# Patient Record
Sex: Male | Born: 1935 | Race: White | Hispanic: No | Marital: Married | Smoking: Never smoker
Health system: Southern US, Community
[De-identification: ages and names within clinical notes are randomized; demographics above are authoritative.]

## PROBLEM LIST (undated history)

## (undated) DIAGNOSIS — N200 Calculus of kidney: Secondary | ICD-10-CM

## (undated) DIAGNOSIS — N400 Enlarged prostate without lower urinary tract symptoms: Secondary | ICD-10-CM

## (undated) DIAGNOSIS — R32 Unspecified urinary incontinence: Secondary | ICD-10-CM

## (undated) DIAGNOSIS — I4891 Unspecified atrial fibrillation: Secondary | ICD-10-CM

## (undated) DIAGNOSIS — J189 Pneumonia, unspecified organism: Secondary | ICD-10-CM

## (undated) DIAGNOSIS — G20 Parkinson's disease: Secondary | ICD-10-CM

## (undated) DIAGNOSIS — F0390 Unspecified dementia without behavioral disturbance: Secondary | ICD-10-CM

## (undated) DIAGNOSIS — E039 Hypothyroidism, unspecified: Secondary | ICD-10-CM

## (undated) DIAGNOSIS — R296 Repeated falls: Secondary | ICD-10-CM

## (undated) DIAGNOSIS — R627 Adult failure to thrive: Secondary | ICD-10-CM

## (undated) DIAGNOSIS — G20A1 Parkinson's disease without dyskinesia, without mention of fluctuations: Secondary | ICD-10-CM

## (undated) HISTORY — PX: HERNIA REPAIR: SHX51

## (undated) HISTORY — DX: Benign prostatic hyperplasia without lower urinary tract symptoms: N40.0

## (undated) HISTORY — DX: Adult failure to thrive: R62.7

## (undated) HISTORY — PX: PROSTATE SURGERY: SHX751

## (undated) HISTORY — DX: Unspecified urinary incontinence: R32

## (undated) HISTORY — DX: Calculus of kidney: N20.0

## (undated) HISTORY — PX: NEPHRECTOMY: SHX65

## (undated) HISTORY — DX: Repeated falls: R29.6

## (undated) HISTORY — PX: BLADDER SURGERY: SHX569

## (undated) HISTORY — DX: Hypothyroidism, unspecified: E03.9

---

## 2006-08-13 ENCOUNTER — Ambulatory Visit: Payer: Self-pay | Admitting: Family Medicine

## 2006-08-15 ENCOUNTER — Ambulatory Visit: Payer: Self-pay | Admitting: Family Medicine

## 2006-08-23 ENCOUNTER — Ambulatory Visit: Payer: Self-pay | Admitting: Family Medicine

## 2006-10-05 ENCOUNTER — Ambulatory Visit: Payer: Self-pay | Admitting: Family Medicine

## 2006-10-10 ENCOUNTER — Ambulatory Visit: Payer: Self-pay | Admitting: Family Medicine

## 2006-10-10 LAB — CONVERTED CEMR LAB
ALT: 11 units/L (ref 0–40)
Chol/HDL Ratio, serum: 5.2
Cholesterol: 159 mg/dL (ref 0–200)
Triglyceride fasting, serum: 82 mg/dL (ref 0–149)

## 2006-10-31 ENCOUNTER — Ambulatory Visit: Payer: Self-pay | Admitting: Family Medicine

## 2006-11-27 DIAGNOSIS — G20 Parkinson's disease: Secondary | ICD-10-CM

## 2006-11-27 DIAGNOSIS — E785 Hyperlipidemia, unspecified: Secondary | ICD-10-CM

## 2007-05-16 ENCOUNTER — Encounter (INDEPENDENT_AMBULATORY_CARE_PROVIDER_SITE_OTHER): Payer: Self-pay | Admitting: Family Medicine

## 2007-06-18 ENCOUNTER — Telehealth (INDEPENDENT_AMBULATORY_CARE_PROVIDER_SITE_OTHER): Payer: Self-pay | Admitting: Family Medicine

## 2007-09-13 ENCOUNTER — Encounter (INDEPENDENT_AMBULATORY_CARE_PROVIDER_SITE_OTHER): Payer: Self-pay | Admitting: Family Medicine

## 2007-10-09 ENCOUNTER — Telehealth (INDEPENDENT_AMBULATORY_CARE_PROVIDER_SITE_OTHER): Payer: Self-pay | Admitting: *Deleted

## 2007-10-17 ENCOUNTER — Encounter (INDEPENDENT_AMBULATORY_CARE_PROVIDER_SITE_OTHER): Payer: Self-pay | Admitting: Family Medicine

## 2008-02-11 ENCOUNTER — Telehealth (INDEPENDENT_AMBULATORY_CARE_PROVIDER_SITE_OTHER): Payer: Self-pay | Admitting: *Deleted

## 2008-04-29 ENCOUNTER — Telehealth (INDEPENDENT_AMBULATORY_CARE_PROVIDER_SITE_OTHER): Payer: Self-pay | Admitting: *Deleted

## 2010-04-28 ENCOUNTER — Ambulatory Visit (HOSPITAL_BASED_OUTPATIENT_CLINIC_OR_DEPARTMENT_OTHER): Admission: RE | Admit: 2010-04-28 | Discharge: 2010-04-28 | Payer: Self-pay | Admitting: Orthopedic Surgery

## 2011-01-22 LAB — POCT HEMOGLOBIN-HEMACUE: Hemoglobin: 14.2 g/dL (ref 13.0–17.0)

## 2011-03-24 NOTE — Assessment & Plan Note (Signed)
[facility]                                 ON-CALL NOTE   Martin Lowe, Martin Lowe                        MRN:          [id number]  DATE:08/05/2006                            DOB:          1934-10-04    Phone number is [phone].   CHIEF COMPLAINT:  UTI.   Patient states he thinks he has a bladder infection.  He has urinary  frequency every 20 to 30 minutes all night.  He saw blood in his urine  today.  He denies fever, chills, back pain or other symptoms.  He says  it really stings to urinate.  I told him he may have a urinary tract  infection and to go to an Urgent Care or the emergency room now for  evaluation before it gets worse and that is what he is going to do.            ______________________________  Audrie Gallus. Tower, MD      MAT/MedQ  DD:  08/05/2006  DT:  08/06/2006  Job #:  [id number]   cc:   Leanne Chang, M.D.

## 2015-11-22 ENCOUNTER — Emergency Department (HOSPITAL_COMMUNITY)
Admission: EM | Admit: 2015-11-22 | Discharge: 2015-11-22 | Disposition: A | Discharge status: Home / Self Care | Payer: Medicare Other | Attending: Emergency Medicine | Admitting: Emergency Medicine

## 2015-11-22 ENCOUNTER — Emergency Department (HOSPITAL_COMMUNITY): Payer: Medicare Other

## 2015-11-22 ENCOUNTER — Encounter (HOSPITAL_COMMUNITY): Payer: Self-pay | Admitting: Emergency Medicine

## 2015-11-22 ENCOUNTER — Other Ambulatory Visit: Payer: Self-pay

## 2015-11-22 DIAGNOSIS — F0390 Unspecified dementia without behavioral disturbance: Secondary | ICD-10-CM | POA: Insufficient documentation

## 2015-11-22 DIAGNOSIS — R51 Headache: Secondary | ICD-10-CM | POA: Insufficient documentation

## 2015-11-22 DIAGNOSIS — N390 Urinary tract infection, site not specified: Secondary | ICD-10-CM | POA: Insufficient documentation

## 2015-11-22 DIAGNOSIS — R4182 Altered mental status, unspecified: Secondary | ICD-10-CM | POA: Diagnosis not present

## 2015-11-22 DIAGNOSIS — G20 Parkinson's disease: Secondary | ICD-10-CM | POA: Insufficient documentation

## 2015-11-22 DIAGNOSIS — R451 Restlessness and agitation: Secondary | ICD-10-CM | POA: Diagnosis not present

## 2015-11-22 DIAGNOSIS — Z79899 Other long term (current) drug therapy: Secondary | ICD-10-CM | POA: Insufficient documentation

## 2015-11-22 DIAGNOSIS — M6281 Muscle weakness (generalized): Secondary | ICD-10-CM | POA: Diagnosis present

## 2015-11-22 HISTORY — DX: Parkinson's disease without dyskinesia, without mention of fluctuations: G20.A1

## 2015-11-22 HISTORY — DX: Unspecified dementia, unspecified severity, without behavioral disturbance, psychotic disturbance, mood disturbance, and anxiety: F03.90

## 2015-11-22 HISTORY — DX: Parkinson's disease: G20

## 2015-11-22 LAB — CBC WITH DIFFERENTIAL/PLATELET
BASOS PCT: 0 %
Basophils Absolute: 0 10*3/uL (ref 0.0–0.1)
EOS ABS: 0 10*3/uL (ref 0.0–0.7)
Eosinophils Relative: 0 %
HCT: 42.6 % (ref 39.0–52.0)
HEMOGLOBIN: 14.5 g/dL (ref 13.0–17.0)
LYMPHS ABS: 0.7 10*3/uL (ref 0.7–4.0)
Lymphocytes Relative: 9 %
MCH: 31.3 pg (ref 26.0–34.0)
MCHC: 34 g/dL (ref 30.0–36.0)
MCV: 91.8 fL (ref 78.0–100.0)
Monocytes Absolute: 1.1 10*3/uL — ABNORMAL HIGH (ref 0.1–1.0)
Monocytes Relative: 15 %
NEUTROS PCT: 76 %
Neutro Abs: 5.8 10*3/uL (ref 1.7–7.7)
Platelets: 279 10*3/uL (ref 150–400)
RBC: 4.64 MIL/uL (ref 4.22–5.81)
RDW: 13.4 % (ref 11.5–15.5)
WBC: 7.7 10*3/uL (ref 4.0–10.5)

## 2015-11-22 LAB — COMPREHENSIVE METABOLIC PANEL
ALT: 8 U/L — ABNORMAL LOW (ref 17–63)
ANION GAP: 11 (ref 5–15)
AST: 23 U/L (ref 15–41)
Albumin: 3.7 g/dL (ref 3.5–5.0)
Alkaline Phosphatase: 69 U/L (ref 38–126)
BUN: 18 mg/dL (ref 6–20)
CALCIUM: 9.5 mg/dL (ref 8.9–10.3)
CHLORIDE: 100 mmol/L — AB (ref 101–111)
CO2: 29 mmol/L (ref 22–32)
Creatinine, Ser: 1.17 mg/dL (ref 0.61–1.24)
GFR calc non Af Amer: 57 mL/min — ABNORMAL LOW (ref >60)
Glucose, Bld: 100 mg/dL — ABNORMAL HIGH (ref 65–99)
POTASSIUM: 4 mmol/L (ref 3.5–5.1)
SODIUM: 140 mmol/L (ref 135–145)
Total Bilirubin: 0.9 mg/dL (ref 0.3–1.2)
Total Protein: 6.5 g/dL (ref 6.5–8.1)

## 2015-11-22 LAB — URINALYSIS, ROUTINE W REFLEX MICROSCOPIC
BILIRUBIN URINE: NEGATIVE
Glucose, UA: NEGATIVE mg/dL
HGB URINE DIPSTICK: NEGATIVE
KETONES UR: 15 mg/dL — AB
Nitrite: POSITIVE — AB
PROTEIN: NEGATIVE mg/dL
Specific Gravity, Urine: 1.018 (ref 1.005–1.030)
pH: 6 (ref 5.0–8.0)

## 2015-11-22 LAB — URINE MICROSCOPIC-ADD ON: RBC / HPF: NONE SEEN RBC/hpf (ref 0–5)

## 2015-11-22 LAB — LIPASE, BLOOD: Lipase: 22 U/L (ref 11–51)

## 2015-11-22 MED ORDER — DEXTROSE 5 % IV SOLN
1.0000 g | Freq: Once | INTRAVENOUS | Status: AC
  Administered 2015-11-22: 1 g via INTRAVENOUS
  Filled 2015-11-22: qty 10

## 2015-11-22 MED ORDER — SODIUM CHLORIDE 0.9 % IV SOLN
INTRAVENOUS | Status: DC
Start: 2015-11-23 — End: 2015-11-22

## 2015-11-22 MED ORDER — CEPHALEXIN 500 MG PO CAPS: 500.0000 mg | ORAL_CAPSULE | Freq: Four times a day (QID) | ORAL | Status: DC

## 2015-11-22 MED ORDER — SODIUM CHLORIDE 0.9 % IV BOLUS (SEPSIS)
250.0000 mL | Freq: Once | INTRAVENOUS | Status: AC
  Administered 2015-11-22: 250 mL via INTRAVENOUS

## 2015-11-22 NOTE — ED Notes (Signed)
Per MD family was notified he can take at home  medication.

## 2015-11-22 NOTE — Discharge Instructions (Signed)
[facility] base [facility] for the Parkinson's disease. Take the antibiotic as directed. Would expect him to be improving in 2 days. Return for any new or worse symptoms.

## 2015-11-22 NOTE — ED Provider Notes (Signed)
CSN: [id number]     Arrival date & time 11/22/15  0945 History   First MD Initiated Contact with Patient 11/22/15 [phone]     Chief Complaint  Patient presents with  . Weakness     (Consider location/radiation/quality/duration/timing/severity/associated sxs/prior Treatment) Patient is a 82 y.o. male presenting with weakness. The history is provided by the patient, the spouse and a relative.  Weakness   patient with known Parkinson's disease and dementia followed the by [facility]. Patient with increased agitation and worsening of his mental status for the past 2-3 days. Family states that he complained of mild headache. No nausea no vomiting no fevers. They believe that his normal baseline weakness to his left hands a little worse than usual.  Past Medical History  Diagnosis Date  . Parkinson disease (HCC)   . Dementia    Past Surgical History  Procedure Laterality Date  . Hernia repair     No family history on file. Social History  Substance Use Topics  . Smoking status: Never Smoker   . Smokeless tobacco: None  . Alcohol Use: No    Review of Systems  Unable to perform ROS: Dementia  Neurological: Positive for weakness.      Allergies  Ciprofloxacin  Home Medications   Prior to Admission medications   Medication Sig Start Date End Date Taking? Authorizing Provider  carbidopa-levodopa (SINEMET CR) 50-200 MG tablet Take 0.5 tablets by mouth 3 (three) times daily. 730a, 1100a, 2pm,   Yes Historical Provider, MD  carbidopa-levodopa (SINEMET IR) 25-100 MG tablet Take 1-1.5 tablets by mouth See admin instructions. Patient takes 1.5 tablets in the morning around 7-8am, 11am, 2 pm  . 0.5 tablet at bedtime.   Yes Historical Provider, MD  DULoxetine (CYMBALTA) 30 MG capsule Take 30 mg by mouth every morning.   Yes Historical Provider, MD  Pimavanserin Tartrate (NUPLAZID) 17 MG TABS Take 34 mg by mouth every morning.   Yes Historical Provider, MD  Polyethylene Glycol 3350 (MIRALAX PO)  Take 17 g by mouth every morning.   Yes Historical Provider, MD  QUEtiapine (SEROQUEL) 25 MG tablet Take 25 mg by mouth at bedtime.   Yes Historical Provider, MD  solifenacin (VESICARE) 10 MG tablet Take 10 mg by mouth daily.   Yes Historical Provider, MD  cephALEXin (KEFLEX) 500 MG capsule Take 1 capsule (500 mg total) by mouth 4 (four) times daily. 11/22/15   Vanetta Mulders, MD   BP 141/73 mmHg  Pulse 79  Resp 18  SpO2 97% Physical Exam  Constitutional: He appears well-developed and well-nourished. No distress.  HENT:  Head: Normocephalic and atraumatic.  Mouth/Throat: Oropharynx is clear and moist.  Eyes: Conjunctivae and EOM are normal. Pupils are equal, round, and reactive to light.  Neck: Normal range of motion. Neck supple.  Cardiovascular: Normal rate and normal heart sounds.   No murmur heard. Pulmonary/Chest: Effort normal and breath sounds normal. No respiratory distress.  Abdominal: Soft. Bowel sounds are normal. There is no tenderness.  Musculoskeletal:  Patient able to follow commands and move all extremities.  Neurological: He is alert.  Long-standing Parkinson's disease known to have weakness on the left greater than right normally right hand greater than right leg. Patient also with mild tremor.  Nursing note and vitals reviewed.   ED Course  Procedures (including critical care time) Labs Review Labs Reviewed  URINALYSIS, ROUTINE W REFLEX MICROSCOPIC (NOT AT [facility]) - Abnormal; Notable for the following:    APPearance HAZY (*)  Ketones, ur 15 (*)    Nitrite POSITIVE (*)    Leukocytes, UA SMALL (*)    All other components within normal limits  COMPREHENSIVE METABOLIC PANEL - Abnormal; Notable for the following:    Chloride 100 (*)    Glucose, Bld 100 (*)    ALT 8 (*)    GFR calc non Af Amer 57 (*)    All other components within normal limits  CBC WITH DIFFERENTIAL/PLATELET - Abnormal; Notable for the following:    Monocytes Absolute 1.1 (*)    All other  components within normal limits  URINE MICROSCOPIC-ADD ON - Abnormal; Notable for the following:    Squamous Epithelial / LPF 0-5 (*)    Bacteria, UA MANY (*)    All other components within normal limits  URINE CULTURE  LIPASE, BLOOD  CBG MONITORING, ED   Results for orders placed or performed during the hospital encounter of 11/22/15  Urinalysis, Routine w reflex microscopic (not at [facility])  Result Value Ref Range   Color, Urine YELLOW YELLOW   APPearance HAZY (A) CLEAR   Specific Gravity, Urine 1.018 1.005 - 1.030   pH 6.0 5.0 - 8.0   Glucose, UA NEGATIVE NEGATIVE mg/dL   Hgb urine dipstick NEGATIVE NEGATIVE   Bilirubin Urine NEGATIVE NEGATIVE   Ketones, ur 15 (A) NEGATIVE mg/dL   Protein, ur NEGATIVE NEGATIVE mg/dL   Nitrite POSITIVE (A) NEGATIVE   Leukocytes, UA SMALL (A) NEGATIVE  Comprehensive metabolic panel  Result Value Ref Range   Sodium 140 135 - 145 mmol/L   Potassium 4.0 3.5 - 5.1 mmol/L   Chloride 100 (L) 101 - 111 mmol/L   CO2 29 22 - 32 mmol/L   Glucose, Bld 100 (H) 65 - 99 mg/dL   BUN 18 6 - 20 mg/dL   Creatinine, Ser 1.611.17 0.61 - 1.24 mg/dL   Calcium 9.5 8.9 - 09.610.3 mg/dL   Total Protein 6.5 6.5 - 8.1 g/dL   Albumin 3.7 3.5 - 5.0 g/dL   AST 23 15 - 41 U/L   ALT 8 (L) 17 - 63 U/L   Alkaline Phosphatase 69 38 - 126 U/L   Total Bilirubin 0.9 0.3 - 1.2 mg/dL   GFR calc non Af Amer 57 (L) >60 mL/min   GFR calc Af Amer >60 >60 mL/min   Anion gap 11 5 - 15  Lipase, blood  Result Value Ref Range   Lipase 22 11 - 51 U/L  CBC with Differential/Platelet  Result Value Ref Range   WBC 7.7 4.0 - 10.5 K/uL   RBC 4.64 4.22 - 5.81 MIL/uL   Hemoglobin 14.5 13.0 - 17.0 g/dL   HCT 04.542.6 40.939.0 - 81.152.0 %   MCV 91.8 78.0 - 100.0 fL   MCH 31.3 26.0 - 34.0 pg   MCHC 34.0 30.0 - 36.0 g/dL   RDW 91.413.4 78.211.5 - 95.615.5 %   Platelets 279 150 - 400 K/uL   Neutrophils Relative % 76 %   Neutro Abs 5.8 1.7 - 7.7 K/uL   Lymphocytes Relative 9 %   Lymphs Abs 0.7 0.7 - 4.0 K/uL    Monocytes Relative 15 %   Monocytes Absolute 1.1 (H) 0.1 - 1.0 K/uL   Eosinophils Relative 0 %   Eosinophils Absolute 0.0 0.0 - 0.7 K/uL   Basophils Relative 0 %   Basophils Absolute 0.0 0.0 - 0.1 K/uL  Urine microscopic-add on  Result Value Ref Range   Squamous Epithelial / LPF 0-5 (A) NONE  SEEN   WBC, UA TOO NUMEROUS TO COUNT 0 - 5 WBC/hpf   RBC / HPF NONE SEEN 0 - 5 RBC/hpf   Bacteria, UA MANY (A) NONE SEEN   Urine-Other MUCOUS PRESENT      Imaging Review Dg Chest 2 View  11/22/2015  CLINICAL DATA:  Left-sided weakness. EXAM: CHEST  2 VIEW COMPARISON:  No prior available for comparison. FINDINGS: Mediastinum and hilar structures normal. Heart size normal. Low lung volumes with mild bibasilar atelectasis and/or infiltrates. No pleural effusion or pneumothorax. IMPRESSION: Low lung volumes with mild bibasilar subsegmental atelectasis and/or infiltrates. Electronically Signed   By: Maisie Fus  Register   On: 11/22/2015 11:55   Ct Head Wo Contrast  11/22/2015  CLINICAL DATA:  Advanced Parkinson disease.  Increased weakness. EXAM: CT HEAD WITHOUT CONTRAST TECHNIQUE: Contiguous axial images were obtained from the base of the skull through the vertex without intravenous contrast. COMPARISON:  07/06/2006 head CT. FINDINGS: No evidence of parenchymal hemorrhage or extra-axial fluid collection. No mass lesion, mass effect, or midline shift. No CT evidence of acute infarction. Intracranial atherosclerosis. Nonspecific mild subcortical and periventricular white matter hypodensity, most in keeping with chronic small vessel ischemic change. Diffuse cerebral volume loss. Cerebral ventricle sizes are concordant with the degree of cerebral volume loss. There is stable chronic complete opacification of the right frontal sinus and anterior right ethmoidal air cells. Otherwise clear visualized paranasal sinuses. The mastoid air cells are unopacified. No evidence of calvarial fracture. IMPRESSION: 1.  No evidence of  acute intracranial abnormality. 2. Diffuse cerebral atrophy, intracranial atherosclerosis and mild chronic small vessel ischemic white matter change. 3. Stable chronic complete right frontal sinus and anterior right ethmoidal air cell opacification, likely representing chronic sinusitis. Electronically Signed   By: Delbert Phenix M.D.   On: 11/22/2015 11:23   Ct Chest Wo Contrast  11/22/2015  CLINICAL DATA:  Shortness of breath weakness possible pneumonia EXAM: CT CHEST WITHOUT CONTRAST TECHNIQUE: Multidetector CT imaging of the chest was performed following the standard protocol without IV contrast. COMPARISON:  11/22/2015 FINDINGS: Mediastinum/Nodes: There is atherosclerotic calcification of the aortic arch and descending thoracic aorta. There is significant 3 vessel coronary artery disease. Given limited evaluation without contrast, there is no significant hilar or mediastinal adenopathy appreciated. There is no significant pericardial effusion. Trace focal small anterior pericardial effusion with trace fluid in superior pericardial recess noted. Lungs/Pleura: No pleural effusion. No evidence of pneumonia. Mild bilateral lower lobe atelectasis. 4 mm pulmonary nodule in the lingula on image number 34. Upper abdomen: Limited visualization of upper abdominal structures show bilateral renal calcifications with cortical renal atrophy bilaterally. There is evidence of constipation with significant fecal retention. There is mild distention of the gallbladder. No findings of definitive acute nature. Musculoskeletal: No acute findings IMPRESSION: Bilateral mild lower lobe atelectasis. 4 mm pulmonary nodule in the lingula. If the patient is at high risk for bronchogenic carcinoma, follow-up chest CT at 1 year is recommended. If the patient is at low risk, no follow-up is needed. This recommendation follows the consensus statement: Guidelines for Management of Small Pulmonary Nodules Detected on CT Scans: A Statement from  the Fleischner Society as published in Radiology 2005; 237:395-400. Electronically Signed   By: Esperanza Heir M.D.   On: 11/22/2015 13:56   I have personally reviewed and evaluated these images and lab results as part of my medical decision-making.   EKG Interpretation   Date/Time:  Monday November 22 2015 09:39:22 EST Ventricular Rate:  82 PR Interval:  153 QRS Duration: 108 QT Interval:  397 QTC Calculation: 464 R Axis:   35 Text Interpretation:  Sinus rhythm Atrial premature complexes RSR' in V1  or V2, right VCD or RVH Nonspecific T abnormalities, anterior leads  Borderline ST elevation, anterior leads Interpretation limited secondary  to artifact Confirmed by Forest Pruden  MD, Ishita Mcnerney [phone]) on 11/22/2015  10:10:44 AM      MDM   Final diagnoses:  UTI (lower urinary tract infection)  Altered mental status, unspecified altered mental status type  Parkinson disease [facility])   Patient with Parkinson's disease followed exclusively at [facility] for this. Patient noted to have changes in his mental status little more agitated than usual over the past 2-3 days. Patient also with complaint of a mild headache. Patient with baseline weakness due to the Parkinson's of left greater than right. Family states that maybe a little more weaker in the left hand than usual. No nausea no vomiting no fevers. No diarrhea. Pupils on exam here were equal and reactive.  Extensive workup here shows evidence of significant urinary tract infection. Patient received 1 g of Rocephin will continue on Keflex at home. Would expect improvement over the next 2 days. Rest of workup to include CT head and CT chest to rule out pneumonia were all negative. For any acute findings. There is evidence of a pulmonary nodule likely followed by his regular doctor. No significant leukocytosis no significant anemia. No significant electrolyte abnormalities.  Patient also known to have the dementia along with the Parkinson's. Urine sent  for culture. Vital signs here without any significant abnormalities. No hypoxia and no tachycardia no hypotension.    Vanetta Mulders, MD 11/22/15 [phone]

## 2015-11-22 NOTE — ED Notes (Signed)
Patient comes from home with complaints of left side weakness. Family states patient now has left side facial droop. Patient able to follow commands. Patient alert to self, time, and place. Patient able to move all extremities. Weaker hand grip noticed on left side.  Drift also noted in both upper and lower Left extremities. Left pupil notice to be little sluggish.

## 2015-11-22 NOTE — ED Notes (Signed)
Meal Tray ordered.  

## 2015-11-23 LAB — CBG MONITORING, ED: GLUCOSE-CAPILLARY: 88 mg/dL (ref 65–99)

## 2015-11-24 LAB — URINE CULTURE

## 2015-11-25 ENCOUNTER — Telehealth (HOSPITAL_COMMUNITY): Payer: Self-pay

## 2015-11-25 NOTE — Telephone Encounter (Signed)
Post ED Visit - Positive Culture Follow-up  Culture report reviewed by antimicrobial stewardship pharmacist:   Enzo Bi, Pharm.D.  Celedonio Miyamoto, Pharm.D., BCPS  Garvin Fila, Pharm.D.  Georgina Pillion, Pharm.D., BCPS  [place], [address].D., BCPS, AAHIVP  Estella Husk, Pharm.D., BCPS, AAHIVP  Tennis Must, Pharm.D.  Sherle Poe, [place].DJacinto Reap, Pharm.D.  Positive urine culture, >/= 100,000 colonies -> Staph. Species Treated with Cephalexin and Rocephin 1 gram in ED.  Tx OK.  Martin Lowe 11/25/2015, 1:06 PM

## 2017-02-01 ENCOUNTER — Non-Acute Institutional Stay (SKILLED_NURSING_FACILITY): Payer: Medicare Other | Admitting: Adult Health

## 2017-02-01 ENCOUNTER — Encounter: Payer: Self-pay | Admitting: Adult Health

## 2017-02-01 DIAGNOSIS — F02818 Dementia in other diseases classified elsewhere, unspecified severity, with other behavioral disturbance: Secondary | ICD-10-CM

## 2017-02-01 DIAGNOSIS — K5901 Slow transit constipation: Secondary | ICD-10-CM

## 2017-02-01 DIAGNOSIS — F339 Major depressive disorder, recurrent, unspecified: Secondary | ICD-10-CM

## 2017-02-01 DIAGNOSIS — G20 Parkinson's disease: Secondary | ICD-10-CM

## 2017-02-01 DIAGNOSIS — R32 Unspecified urinary incontinence: Secondary | ICD-10-CM

## 2017-02-01 DIAGNOSIS — N400 Enlarged prostate without lower urinary tract symptoms: Secondary | ICD-10-CM | POA: Diagnosis not present

## 2017-02-01 DIAGNOSIS — F0281 Dementia in other diseases classified elsewhere with behavioral disturbance: Secondary | ICD-10-CM

## 2017-02-01 DIAGNOSIS — R296 Repeated falls: Secondary | ICD-10-CM

## 2017-02-01 DIAGNOSIS — G20A1 Parkinson's disease without dyskinesia, without mention of fluctuations: Secondary | ICD-10-CM

## 2017-02-01 NOTE — Progress Notes (Signed)
DATE:  02/01/2017   MRN:  [id number]  BIRTHDAY: 24-Oct-1934  Facility:  Nursing Home Location[facility]     LEVEL OF CARE:  SNF [phone](31)  Contact Information    Name Relation Home Work Mobile   Martin Lowe,Martin Lowe Spouse [phone]  [phone]   Mustin,Cynthia Daughter [phone]     Gena FrayStewart,Leon Son [phone]         Code Status History    This patient does not have a recorded code status. Please follow your organizational policy for patients in this situation.       Chief Complaint  Patient presents with  . Acute Visit    HISTORY OF PRESENT ILLNESS:  This is an 82-YO male seen for an acute visit.  He was discharged from hospice care and [facility] 01/[facility]/2017 for potential LTC placement. Wife was @ bedside. Patient does not remember whether he has eaten or not. CNA reminded him that he has eaten breakfast already. Patient has Parkinson's disease, dementia, BPH, hypothyroidism, failure to thrive, frequent falls and urinary incontinence.   PAST MEDICAL HISTORY:  Past Medical History:  Diagnosis Date  . BPH (benign prostatic hyperplasia)   . Dementia   . Failure to thrive in adult   . Frequent falls   . Hypothyroidism   . Kidney stone   . Parkinson disease (HCC)   . Urinary incontinence      CURRENT MEDICATIONS: Reviewed  Patient's Medications  New Prescriptions   No medications on file  Previous Medications   CARBIDOPA-LEVODOPA (SINEMET CR) 50-200 MG TABLET    Take 1 tablet by mouth 2 (two) times daily. 730a, 1100a, 2pm,    CARBIDOPA-LEVODOPA (SINEMET IR) 25-100 MG TABLET    Take 1 tablet by mouth See admin instructions.    CEPHALEXIN (KEFLEX) 500 MG CAPSULE    Take 1 capsule (500 mg total) by mouth 4 (four) times daily.   DULOXETINE (CYMBALTA) 30 MG CAPSULE    Take 30 mg by mouth every morning.   PIMAVANSERIN TARTRATE (NUPLAZID) 17 MG TABS    Take 34 mg by mouth every morning.    POLYETHYLENE GLYCOL  3350 (MIRALAX PO)    Take 17 g by mouth every morning.   QUETIAPINE (SEROQUEL) 25 MG TABLET    Take 25 mg by mouth at bedtime.   SOLIFENACIN (VESICARE) 10 MG TABLET    Take 10 mg by mouth daily.   Modified Medications   No medications on file  Discontinued Medications   No medications on file     Allergies  Allergen Reactions  . Ciprofloxacin Hives and Rash  . Nitrofurantoin Rash     REVIEW OF SYSTEMS:  GENERAL: no change in appetite, no fatigue, no weight changes, no fever, chills or weakness EYES: Denies change in vision, dry eyes, eye pain, itching or discharge EARS: Denies change in hearing, ringing in ears, or earache NOSE: Denies nasal congestion or epistaxis MOUTH and THROAT: Denies oral discomfort, gingival pain or bleeding, pain from teeth or hoarseness   RESPIRATORY: no cough, SOB, DOE, wheezing, hemoptysis CARDIAC: no chest pain, edema or palpitations GI: no abdominal pain, diarrhea, constipation, heart burn, nausea or vomiting GU: Denies dysuria, frequency, hematuria, incontinence, or discharge PSYCHIATRIC: Denies feeling of depression or anxiety. No report of hallucinations, insomnia, paranoia, or agitation     PHYSICAL EXAMINATION  GENERAL APPEARANCE: Well nourished. In no acute distress. Normal body habitus SKIN:  Skin is warm and dry.  HEAD: Normal in size and contour. No evidence of trauma EYES: Lids open and close normally. No blepharitis, entropion or ectropion. PERRL. Conjunctivae are clear and sclerae are white. Lenses are without opacity EARS: Pinnae are normal. Patient hears normal voice tunes of the examiner MOUTH and THROAT: Lips are without lesions. Oral mucosa is moist and without lesions. Tongue is normal in shape, size, and color and without lesions NECK: supple, trachea midline, no neck masses, no thyroid tenderness, no thyromegaly LYMPHATICS: no LAN in the neck, no supraclavicular LAN RESPIRATORY: breathing is even & unlabored, BS  CTAB CARDIAC: RRR, no murmur,no extra heart sounds, no edema GI: abdomen soft, normal BS, no masses, no tenderness, no hepatomegaly, no splenomegaly EXTREMITIES:  Able to move BUE, BLE has generalized weakness NEUROLOGICAL: There is no tremor. Speech is clear. Alert and oriented X 3; has short-term memory loss PSYCHIATRIC: Alert and oriented X 3. Affect and behavior are appropriate  LABS/RADIOLOGY: None to review    ASSESSMENT/PLAN:  Parkinson's disease - continue Sinemet 25-100 mg 1 tab by mouth every 8 AM, 11 AM and 2 PM, Sinemet 25-250 mg 1 1/2 tab by mouth every 8 a.m., Sinemet 25-250 mg 1 tab by mouth every 11 AM, 2 PM, 5 PM and 8 PM, Sinemet 25-250 mg 1/2 tab by mouth every 10 PM , Nuplazid 17 mg 2 tabs by mouth daily; follows with neurology at [facility], Dr. Lorin Picket; continue supportive care; fall precautions; check CBC and CMP  Major depression - continue Cymbalta 30 mg 1 by mouth twice a day daily  Dementia due to parkinson's disease with behavioral disturbance - continue Seroquel 50 mg 1 1/2 tab= 75 mg by mouth daily at bedtime; continue supportive care; fall precautions; check tsh and lipid panel; psych consult  Urinary incontinence - continue Vesicare 10 mg 1 tab by mouth daily at bedtime  BPH - continue Flomax 0.4 mg 1 capsule by mouth daily at bedtime  Constipation - continue MiraLAX 17 g by mouth daily   Frequent falls - fall precautions, check vitamin D 25 hydroxy      Goals of care:  Long-term care    Anabelen Kaminsky C. Medina-Vargas - NP    [employer] [phone]

## 2017-02-05 ENCOUNTER — Encounter: Payer: Self-pay | Admitting: Internal Medicine

## 2017-02-05 ENCOUNTER — Non-Acute Institutional Stay (SKILLED_NURSING_FACILITY): Payer: Medicare Other | Admitting: Internal Medicine

## 2017-02-05 DIAGNOSIS — K5909 Other constipation: Secondary | ICD-10-CM

## 2017-02-05 DIAGNOSIS — G4700 Insomnia, unspecified: Secondary | ICD-10-CM

## 2017-02-05 DIAGNOSIS — F329 Major depressive disorder, single episode, unspecified: Secondary | ICD-10-CM

## 2017-02-05 DIAGNOSIS — E43 Unspecified severe protein-calorie malnutrition: Secondary | ICD-10-CM

## 2017-02-05 DIAGNOSIS — R350 Frequency of micturition: Secondary | ICD-10-CM | POA: Diagnosis not present

## 2017-02-05 DIAGNOSIS — F0281 Dementia in other diseases classified elsewhere with behavioral disturbance: Secondary | ICD-10-CM

## 2017-02-05 DIAGNOSIS — R531 Weakness: Secondary | ICD-10-CM | POA: Diagnosis not present

## 2017-02-05 DIAGNOSIS — G20 Parkinson's disease: Secondary | ICD-10-CM | POA: Diagnosis not present

## 2017-02-05 DIAGNOSIS — N401 Enlarged prostate with lower urinary tract symptoms: Secondary | ICD-10-CM

## 2017-02-05 DIAGNOSIS — F02818 Dementia in other diseases classified elsewhere, unspecified severity, with other behavioral disturbance: Secondary | ICD-10-CM

## 2017-02-05 LAB — BASIC METABOLIC PANEL
BUN: 19 mg/dL (ref 4–21)
CREATININE: 0.9 mg/dL (ref 0.6–1.3)
Glucose: 86 mg/dL
Potassium: 4.2 mmol/L (ref 3.4–5.3)
Sodium: 143 mmol/L (ref 137–147)

## 2017-02-05 LAB — LIPID PANEL
CHOLESTEROL: 140 mg/dL (ref 0–200)
HDL: 31 mg/dL — AB (ref 35–70)
LDL Cholesterol: 92 mg/dL
Triglycerides: 86 mg/dL (ref 40–160)

## 2017-02-05 LAB — CBC AND DIFFERENTIAL
HCT: 38 % — AB (ref 41–53)
Hemoglobin: 12.6 g/dL — AB (ref 13.5–17.5)
NEUTROS ABS: 3 /uL
Platelets: 286 10*3/uL (ref 150–399)
WBC: 4.8 10*3/mL

## 2017-02-05 LAB — HEPATIC FUNCTION PANEL
ALK PHOS: 67 U/L (ref 25–125)
ALT: 10 U/L (ref 10–40)
AST: 13 U/L — AB (ref 14–40)
Bilirubin, Total: 0.4 mg/dL

## 2017-02-05 LAB — TSH: TSH: 5.27 u[IU]/mL (ref 0.41–5.90)

## 2017-02-05 LAB — VITAMIN D 25 HYDROXY (VIT D DEFICIENCY, FRACTURES): VIT D 25 HYDROXY: 47.08

## 2017-02-05 NOTE — Progress Notes (Signed)
LOCATION: [facility]  PCP: No primary care provider on file.   Code Status: Full Code  Goals of care: Advanced Directive information Advanced Directives 11/22/2015  Does Patient Have a Medical Advance Directive? Yes  Type of Estate agent of [place];Out of facility DNR (pink MOST or yellow form)  Does patient want to make changes to medical advance directive? No - Patient declined  Copy of Healthcare Power of Attorney in Chart? No - copy requested       Extended Emergency Contact Information Primary Emergency Contact: Neale,Evelyn Address: [address]          [place], [place] [address] [place] Home Phone: [phone] Mobile Phone: [phone] Relation: Spouse Secondary Emergency Contact: Mustin,Cynthia  [place] Home Phone: [phone] Relation: Daughter   Allergies  Allergen Reactions  . Ciprofloxacin Hives and Rash  . Nitrofurantoin Rash    Chief Complaint  Patient presents with  . New Admit To SNF    New Admission Visit      HPI:  Patient is a 83 y.o. male seen today for long term care admission visit. He was residing in hospice home prior to this. He has medical history of parkinson's disease, BPH, dementia, FTT, frequent falls among others. He is seen in his room today.   Review of Systems:  Constitutional: Negative for fever and diaphoresis. Feels weak and tired.  HENT: Negative for headache, congestion, nasal discharge, difficulty swallowing.   Eyes: Negative for double vision and discharge.  Wears glasses. Respiratory: Negative for cough, shortness of breath Cardiovascular: Negative for chest pain, palpitation.  Gastrointestinal: Negative for heartburn, nausea, vomiting, abdominal pain. Last bowel movement was this morning. Genitourinary: Negative for dysuria. Musculoskeletal: Negative for back pain, fall in the facility. Positive for pain to right knee.  Skin: Negative for itching,  rash.  Neurological: Negative for dizziness. Psychiatric/Behavioral: Negative for depression   Past Medical History:  Diagnosis Date  . BPH (benign prostatic hyperplasia)   . Dementia   . Failure to thrive in adult   . Frequent falls   . Hypothyroidism   . Kidney stone   . Parkinson disease (HCC)   . Urinary incontinence    Past Surgical History:  Procedure Laterality Date  . BLADDER SURGERY    . HERNIA REPAIR    . NEPHRECTOMY    . PROSTATE SURGERY     Social History:   reports that he has never smoked. He has never used smokeless tobacco. He reports that he does not drink alcohol or use drugs.  Family History  Problem Relation Age of Onset  . Alzheimer's disease Mother   . COPD Father     Medications: Allergies as of 02/05/2017      Reactions   Ciprofloxacin Hives, Rash   Nitrofurantoin Rash      Medication List       Accurate as of 02/05/17  2:56 PM. Always use your most recent med list.          carbidopa-levodopa 25-100 MG tablet Commonly known as:  SINEMET IR Take by mouth 3 (three) times daily. Take 1 and a half tab @@ 8 am, 11 am and 2 pm   carbidopa-levodopa 25-250 MG tablet Commonly known as:  SINEMET IR Take 1 tablet by mouth. Take @ 11 am, 2 pm, 5 pm and 8 pm. Take 1/2 tab @ 10 pm   CARDIO OMEGA BENEFITS/VIT D-3 PO Take 2,000 Units by mouth daily.  DULoxetine 30 MG capsule Commonly known as:  CYMBALTA Take 30 mg by mouth every morning.   METAMUCIL FIBER PO Take 1 scoop by mouth daily.   MIRALAX PO Take 17 g by mouth every morning.   NUPLAZID 17 MG Tabs Generic drug:  Pimavanserin Tartrate Take 34 mg by mouth every morning.   QUEtiapine 25 MG tablet Commonly known as:  SEROQUEL Take 75 mg by mouth at bedtime.   solifenacin 10 MG tablet Commonly known as:  VESICARE Take 10 mg by mouth daily.   tamsulosin 0.4 MG Caps capsule Commonly known as:  FLOMAX Take 0.4 mg by mouth at bedtime.   UNABLE TO FIND Med Name: Lavender oil.  Apply one drop of lavender oil behind each ear   UNABLE TO FIND Med Name: Mighty shake by mouth daily       Immunizations:  There is no immunization history on file for this patient.   Physical Exam: Vitals:   02/05/17 1440  BP: (!) 112/57  Pulse: 61  Resp: 18  Temp: 97.1 F (36.2 C)  TempSrc: Oral  SpO2: 98%  Weight: 149 lb 9.6 oz (67.9 kg)  Height:  (1.778 m)   Body mass index is 21.47 kg/m.  General- elderly male, frail and thin built, in no acute distress Head- normocephalic, atraumatic Nose- no nasal discharge Throat- moist mucus membrane, has dentures Eyes- PERRLA, EOMI, no pallor, no icterus, no discharge, normal conjunctiva, normal sclera Neck- no cervical lymphadenopathy Cardiovascular- normal s1,s2, no murmur Respiratory- bilateral clear to auscultation, no wheeze, no rhonchi, no crackles, no use of accessory muscles Abdomen- bowel sounds present, soft, non tender, no guarding or rigidity Musculoskeletal- able to move all 4 extremities, generalized weakness present, no leg edema Neurological- alert and oriented to person, place and time, resting tremor present Skin- warm and dry Psychiatry- normal mood and affect    Labs reviewed: none available   Assessment/Plan   Generalized weakness From deconditioning. Was at hospice home with poor prognosis. Here for rehabilitation and long term care. Will have him work with physical therapy and occupational therapy team to help with gait training and muscle strengthening exercises.fall precautions. Skin care. Encourage to be out of bed.   Parkinson's disease On sinemet current regimen, dose has been clarified with his wife by NP. Continue nuplazid  Dementia with behavior disturbance aao x3 today. Provide supportive care. Continue seroquel 75 mg daily  Chronic depression Mood appears stable. Continue cymbalta 30 mg daily   BPH With UI, continue vesicare and flomax  Insomnia On lavender oil, add  melatonin at bedtime and monitor  Severe protein calorie malnutrition To assist with feeding. Continue mighty shake, have RD to evaluate and monitor weekly weight  Chronic constipation Continue metamucil and miralax, monitor clinically    Goals of care: long term care   Labs/tests ordered: cbc, bmp  Family/ staff Communication: reviewed care plan with patient and nursing supervisor  I have spent greater than 50 minutes for this encounter which includes reviewing hospital records, addressing above mentioned concerns, reviewing care plan with patient, answering patient's concerns and counseling.     Oneal Grout, MD Internal Medicine [facility] [address] [place], [place] [address] Cell Phone (Monday-Friday 8 am - 5 pm): [phone] On Call: [phone] and follow prompts after 5 pm and on weekends Office Phone: [phone] Office Fax: [phone]

## 2017-02-07 DIAGNOSIS — E43 Unspecified severe protein-calorie malnutrition: Secondary | ICD-10-CM | POA: Insufficient documentation

## 2017-02-07 DIAGNOSIS — K5909 Other constipation: Secondary | ICD-10-CM | POA: Insufficient documentation

## 2017-02-07 DIAGNOSIS — F0281 Dementia in other diseases classified elsewhere with behavioral disturbance: Secondary | ICD-10-CM | POA: Insufficient documentation

## 2017-02-07 DIAGNOSIS — G4700 Insomnia, unspecified: Secondary | ICD-10-CM | POA: Insufficient documentation

## 2017-02-07 DIAGNOSIS — G20 Parkinson's disease: Secondary | ICD-10-CM

## 2017-02-07 DIAGNOSIS — R350 Frequency of micturition: Secondary | ICD-10-CM

## 2017-02-07 DIAGNOSIS — F329 Major depressive disorder, single episode, unspecified: Secondary | ICD-10-CM | POA: Insufficient documentation

## 2017-02-07 DIAGNOSIS — N401 Enlarged prostate with lower urinary tract symptoms: Secondary | ICD-10-CM | POA: Insufficient documentation

## 2017-02-07 DIAGNOSIS — F02818 Dementia in other diseases classified elsewhere, unspecified severity, with other behavioral disturbance: Secondary | ICD-10-CM | POA: Insufficient documentation

## 2017-02-07 DIAGNOSIS — G20A1 Parkinson's disease without dyskinesia, without mention of fluctuations: Secondary | ICD-10-CM | POA: Insufficient documentation

## 2017-03-07 ENCOUNTER — Non-Acute Institutional Stay (SKILLED_NURSING_FACILITY): Payer: Medicare Other | Admitting: Adult Health

## 2017-03-07 DIAGNOSIS — G4700 Insomnia, unspecified: Secondary | ICD-10-CM

## 2017-03-07 DIAGNOSIS — F29 Unspecified psychosis not due to a substance or known physiological condition: Secondary | ICD-10-CM

## 2017-03-07 DIAGNOSIS — N401 Enlarged prostate with lower urinary tract symptoms: Secondary | ICD-10-CM

## 2017-03-07 DIAGNOSIS — F341 Dysthymic disorder: Secondary | ICD-10-CM | POA: Diagnosis not present

## 2017-03-07 DIAGNOSIS — R4189 Other symptoms and signs involving cognitive functions and awareness: Secondary | ICD-10-CM

## 2017-03-07 DIAGNOSIS — F329 Major depressive disorder, single episode, unspecified: Secondary | ICD-10-CM

## 2017-03-07 DIAGNOSIS — R350 Frequency of micturition: Secondary | ICD-10-CM

## 2017-03-07 DIAGNOSIS — G20 Parkinson's disease: Secondary | ICD-10-CM

## 2017-03-07 LAB — TSH: TSH: 2.45 u[IU]/mL (ref 0.41–5.90)

## 2017-03-13 ENCOUNTER — Encounter: Payer: Self-pay | Admitting: Adult Health

## 2017-03-13 NOTE — Progress Notes (Signed)
DATE:  03/07/2017  MRN:  [id number]  BIRTHDAY: 03-Oct-1934  Facility:  Nursing Home Location:  [facility]  Nursing Home Room Number: 1003-B  LEVEL OF CARE:  SNF [phone]  Contact Information    Name Relation Home Work Mobile   Nesler,Evelyn Spouse [phone]  [phone]   Mustin,Cynthia Daughter [phone]     Gena FrayStewart,Leon Son [phone]         Code Status History    This patient does not have a recorded code status. Please follow your organizational policy for patients in this situation.       Chief Complaint  Patient presents with  . Medical Management of Chronic Issues    HISTORY OF PRESENT ILLNESS:  This is an 82-YO male seen for a routine visit.  He was seen today in his room. Staff reported that he gets agitated at night. He is currently having speech therapy for cognitive impairment.     PAST MEDICAL HISTORY:  Past Medical History:  Diagnosis Date  . BPH (benign prostatic hyperplasia)   . Dementia   . Failure to thrive in adult   . Frequent falls   . Hypothyroidism   . Kidney stone   . Parkinson disease (HCC)   . Urinary incontinence      CURRENT MEDICATIONS: Reviewed  Patient's Medications  New Prescriptions   No medications on file  Previous Medications   ACETAMINOPHEN (TYLENOL) 325 MG TABLET    Take 650 mg by mouth every 4 (four) hours as needed for mild pain.   CARBIDOPA-LEVODOPA (SINEMET IR) 25-100 MG TABLET    Take 1 tablet by mouth 3 (three) times daily. Take at 8A, 11A, 2P   CARBIDOPA-LEVODOPA (SINEMET IR) 25-250 MG TABLET    Take 1 tablet by mouth. Take @ 11 am, 2 pm, 5 pm and 8 pm. Take 1/2 tab @ 10 pm   CARBIDOPA-LEVODOPA (SINEMET) 25-250 MG TABLET    Take 1.5 tablets by mouth daily. Take 1-1/2 tabs at 8AM   CHOLECALCIFEROL (VITAMIN D) 1000 UNITS TABLET    Take 2,000 Units by mouth daily. 2 tabs to = 2000 units   LAMOTRIGINE (LAMICTAL) 25 MG TABLET    Take 25 mg by mouth 2 (two) times daily.   MELATONIN 5 MG TABS     Take 1 tablet by mouth at bedtime. Give 1/2 hour prior to bedtime   PIMAVANSERIN TARTRATE (NUPLAZID) 17 MG TABS    Take 34 mg by mouth every morning.    POLYETHYLENE GLYCOL 3350 (MIRALAX PO)    Take 17 g by mouth every morning.   PSYLLIUM (METAMUCIL FIBER PO)    Take 1 scoop by mouth daily.   QUETIAPINE (SEROQUEL) 25 MG TABLET    Take 75 mg by mouth at bedtime.   QUETIAPINE (SEROQUEL) 25 MG TABLET    Take 25 mg by mouth at bedtime as needed. Take in addition to scheduled dose   SOLIFENACIN (VESICARE) 10 MG TABLET    Take 10 mg by mouth at bedtime.    TAMSULOSIN (FLOMAX) 0.4 MG CAPS CAPSULE    Take 0.4 mg by mouth at bedtime.   UNABLE TO FIND    Med Name: Lavender oil. Apply one drop of lavender oil behind each ear   UNABLE TO FIND    Med Name: Mighty shake by mouth daily  Modified Medications   No medications on file  Discontinued Medications   CARBIDOPA-LEVODOPA (SINEMET IR) 25-100 MG TABLET    Take by  mouth daily. Take 1 and a half tab @ 8 am   DULOXETINE (CYMBALTA) 30 MG CAPSULE    Take 30 mg by mouth every morning.   OMEGA-3 FAT AC-CHOLECALCIFEROL (CARDIO OMEGA BENEFITS/VIT D-3 PO)    Take 2,000 Units by mouth daily.     Allergies  Allergen Reactions  . Ciprofloxacin Hives and Rash  . Nitrofurantoin Rash     REVIEW OF SYSTEMS:  GENERAL: no change in appetite, no fatigue, no weight changes, no fever, chills or weakness EYES: Denies change in vision, dry eyes, eye pain, itching or discharge EARS: Denies change in hearing, ringing in ears, or earache NOSE: Denies nasal congestion or epistaxis MOUTH and THROAT: Denies oral discomfort, gingival pain or bleeding, pain from teeth or hoarseness   RESPIRATORY: no cough, SOB, DOE, wheezing, hemoptysis CARDIAC: no chest pain, edema or palpitations GI: no abdominal pain, diarrhea, constipation, heart burn, nausea or vomiting GU: Denies dysuria, frequency, hematuria, incontinence, or discharge PSYCHIATRIC: + agitation @  night    PHYSICAL EXAMINATION  GENERAL APPEARANCE: Well nourished. In no acute distress. Normal body habitus SKIN:  Skin is warm and dry.  HEAD: Normal in size and contour. No evidence of trauma EYES: Lids open and close normally. No blepharitis, entropion or ectropion. PERRL. Conjunctivae are clear and sclerae are white. Lenses are without opacity EARS: Pinnae are normal. Patient hears normal voice tunes of the examiner MOUTH and THROAT: Lips are without lesions. Oral mucosa is moist and without lesions. Tongue is normal in shape, size, and color and without lesions NECK: supple, trachea midline, no neck masses, no thyroid tenderness, no thyromegaly LYMPHATICS: no LAN in the neck, no supraclavicular LAN RESPIRATORY: breathing is even & unlabored, BS CTAB CARDIAC: RRR, no murmur,no extra heart sounds, no edema GI: abdomen soft, normal BS, no masses, no tenderness, no hepatomegaly, no splenomegaly EXTREMITIES:  Able to move X 4 extremities, BLE with generalized weakness PSYCHIATRIC: Alert and oriented X 3. Affect and behavior are appropriate   LABS/RADIOLOGY: Labs reviewed: Basic Metabolic Panel:  Recent Labs  40/98/11  NA 143  K 4.2  BUN 19  CREATININE 0.9   Liver Function Tests:  Recent Labs  02/05/17  AST 13*  ALT 10  ALKPHOS 67   CBC:  Recent Labs  02/05/17  WBC 4.8  NEUTROABS 3  HGB 12.6*  HCT 38*  PLT 286   Lipid Panel:  Recent Labs  02/05/17  HDL 31*    ASSESSMENT/PLAN:  BPH - continue Flomax 0.4 mg 1 capsule by mouth daily at bedtime  Psychosis - start Seroquel 25 mg 1 tab by mouth daily at bedtime when necessary, psych consult  Parkinson's disease - continue Sinemet 25-250 mg 1 1/2 tab by mouth every 8 AM, Sinemet 25-250 mg 1 tab by mouth every 11AM,  2PM, 5PM and 8PM, Sinemet 25-250 mg 1/2 tab PO Q 10 PM  Depression - recently started on Lamictal 25 mg BID  Insomnia - continue melatonin 5 mg 1 tab by mouth daily at bedtime  Cognitive  impairment - currently having speech therapy, continue supportive care     Goals of care:  Long-term care    Fallynn Gravett C. Medina-Vargas - NP    [facility] [phone]

## 2017-03-24 LAB — BASIC METABOLIC PANEL
BUN: 19 mg/dL (ref 4–21)
Creatinine: 1.2 mg/dL (ref 0.6–1.3)
Glucose: 96 mg/dL
POTASSIUM: 4.5 mmol/L (ref 3.4–5.3)
Sodium: 142 mmol/L (ref 137–147)

## 2017-03-24 LAB — CBC AND DIFFERENTIAL
HEMATOCRIT: 46 % (ref 41–53)
HEMOGLOBIN: 15.7 g/dL (ref 13.5–17.5)
PLATELETS: 388 10*3/uL (ref 150–399)
WBC: 4.4 10^3/mL

## 2017-03-29 LAB — BASIC METABOLIC PANEL
BUN: 20 mg/dL (ref 4–21)
Creatinine: 1.3 mg/dL (ref 0.6–1.3)
Glucose: 79 mg/dL
Potassium: 4.6 mmol/L (ref 3.4–5.3)
SODIUM: 138 mmol/L (ref 137–147)

## 2017-04-04 ENCOUNTER — Encounter: Payer: Self-pay | Admitting: Adult Health

## 2017-04-04 ENCOUNTER — Non-Acute Institutional Stay (SKILLED_NURSING_FACILITY): Payer: Medicare Other | Admitting: Adult Health

## 2017-04-04 DIAGNOSIS — N390 Urinary tract infection, site not specified: Secondary | ICD-10-CM

## 2017-04-04 DIAGNOSIS — R400 Somnolence: Secondary | ICD-10-CM | POA: Diagnosis not present

## 2017-04-04 DIAGNOSIS — R638 Other symptoms and signs concerning food and fluid intake: Secondary | ICD-10-CM

## 2017-04-04 NOTE — Progress Notes (Signed)
Patient ID: Martin Lowe, male   DOB: Mar 07, 1934, 83 y.o.   MRN: [id number]    DATE:   04/04/17  MRN:  [id number]  BIRTHDAY: 06/15/1934  Facility:  Nursing Home Location:  [facility]  Nursing Home Room Number: 1003-B  LEVEL OF CARE:  SNF [phone])  Contact Information    Name Relation Home Work Mobile   Macleod,Evelyn Spouse [phone]  [phone]   Mustin,Cynthia Daughter [phone]     Izzac, Rockett [phone]         Code Status History    This patient does not have a recorded code status. Please follow your organizational policy for patients in this situation.       Chief Complaint  Patient presents with  . Acute Visit    Somnolence    HISTORY OF PRESENT ILLNESS:  This is an 83-YO male seen for an acute visit. Wife is concerned that patient is very sleepy till noon and might be having an on-going infection, UTI. He had recently finished a course of Bactrim DS X 7 days for UTI. Asked charge nurse to obtain a UA w/ CS but was unsuccessful. Patient reported that he has been having poor appetite and has not been drinking much. He was seen in the room and was seen with his eyes closed. Wife was at bedside. Wife reported that she fed him his lunch and only drank 1/2 cup of water.    PAST MEDICAL HISTORY:  Past Medical History:  Diagnosis Date  . BPH (benign prostatic hyperplasia)   . Dementia   . Failure to thrive in adult   . Frequent falls   . Hypothyroidism   . Kidney stone   . Parkinson disease (HCC)   . Urinary incontinence      CURRENT MEDICATIONS: Reviewed  Patient's Medications  New Prescriptions   No medications on file  Previous Medications   ACETAMINOPHEN (TYLENOL) 325 MG TABLET    Take 650 mg by mouth every 4 (four) hours as needed for mild pain.   CARBIDOPA-LEVODOPA (SINEMET IR) 25-100 MG TABLET    Take 1 tablet by mouth 3 (three) times daily. Take at 8A, 11A, 2P   CARBIDOPA-LEVODOPA (SINEMET IR) 25-250 MG TABLET    Take by  mouth. Take @ 8 am, 11 am, 2 pm, 5 pm and 8 pm.   Take 1/2 tab at 10 pm.   CHOLECALCIFEROL (VITAMIN D) 1000 UNITS TABLET    Take 2,000 Units by mouth daily. 2 tabs to = 2000 units   MELATONIN 5 MG TABS    Take 1 tablet by mouth at bedtime. Give 1/2 hour prior to bedtime   PIMAVANSERIN TARTRATE (NUPLAZID) 17 MG TABS    Take 34 mg by mouth every morning.    POLYETHYLENE GLYCOL 3350 (MIRALAX PO)    Take 17 g by mouth every morning.   PSYLLIUM (METAMUCIL FIBER PO)    Take 1 scoop by mouth daily.   QUETIAPINE (SEROQUEL) 100 MG TABLET    Take 100 mg by mouth at bedtime.   SOLIFENACIN (VESICARE) 10 MG TABLET    Take 10 mg by mouth at bedtime.    TAMSULOSIN (FLOMAX) 0.4 MG CAPS CAPSULE    Take 0.4 mg by mouth at bedtime.   UNABLE TO FIND    Med Name: Lavender oil. Apply one drop of lavender oil behind each ear   UNABLE TO FIND    Med Name: Mighty shake by mouth daily  Modified Medications  No medications on file  Discontinued Medications   CARBIDOPA-LEVODOPA (SINEMET) 25-250 MG TABLET    Take 1.5 tablets by mouth daily. Take 1-1/2 tabs at 8AM   LAMOTRIGINE (LAMICTAL) 25 MG TABLET    Take 25 mg by mouth 2 (two) times daily.   QUETIAPINE (SEROQUEL) 25 MG TABLET    Take 75 mg by mouth at bedtime.   QUETIAPINE (SEROQUEL) 25 MG TABLET    Take 25 mg by mouth at bedtime as needed. Take in addition to scheduled dose     Allergies  Allergen Reactions  . Ciprofloxacin Hives and Rash  . Nitrofurantoin Rash     REVIEW OF SYSTEMS:  GENERAL:  no fatigue, no weight changes, no fever, chills or weakness, + poor appetite EYES: Denies change in vision, dry eyes, eye pain, itching or discharge EARS: Denies change in hearing, ringing in ears, or earache NOSE: Denies nasal congestion or epistaxis MOUTH and THROAT: Denies oral discomfort, gingival pain or bleeding, pain from teeth or hoarseness   RESPIRATORY: no cough, SOB, DOE, wheezing, hemoptysis CARDIAC: no chest pain, edema or palpitations GI: no  abdominal pain, diarrhea, constipation, heart burn, nausea or vomiting GU: Denies dysuria, frequency, hematuria, incontinence, or discharge    PHYSICAL EXAMINATION  GENERAL APPEARANCE: Well nourished. In no acute distress. Normal body habitus SKIN:  Skin is warm and dry.  HEAD: Normal in size and contour. No evidence of trauma EYES: Lids open and close normally. No blepharitis, entropion or ectropion. PERRL. Conjunctivae are clear and sclerae are white. Lenses are without opacity EARS: Pinnae are normal. Patient hears normal voice tunes of the examiner MOUTH and THROAT: Lips are without lesions. Oral mucosa is moist and without lesions. Tongue is normal in shape, size, and color and without lesions NECK: supple, trachea midline, no neck masses, no thyroid tenderness, no thyromegaly LYMPHATICS: no LAN in the neck, no supraclavicular LAN RESPIRATORY: breathing is even & unlabored, BS CTAB CARDIAC: RRR, no murmur,no extra heart sounds, no edema GI: abdomen soft, normal BS, no masses, no tenderness, no hepatomegaly, no splenomegaly EXTREMITIES:  Able to move X 4 extremities, BLE with generalized weakness PSYCHIATRIC: Alert and oriented X 3. Sleepy   LABS/RADIOLOGY: Labs reviewed: Basic Metabolic Panel:  Recent Labs  82/95/6202/12/24 03/24/17 03/29/17  NA 143 142 138  K 4.2 4.5 4.6  BUN 19 19 20   CREATININE 0.9 1.2 1.3   Liver Function Tests:  Recent Labs  02/05/17  AST 13*  ALT 10  ALKPHOS 67   CBC:  Recent Labs  02/05/17 03/24/17  WBC 4.8 4.4  NEUTROABS 3  --   HGB 12.6* 15.7  HCT 38* 46  PLT 286 388   Lipid Panel:  Recent Labs  02/05/17  HDL 31*    ASSESSMENT/PLAN:  Poor fluid intake - start D5 0.9NS @ 70 ml/hour via IV  X 1L; check BMP and CBC  UTI - recently finished Bactrim DS X 7 days course; check for UA w/ CS  Somnolence - decrease Melatonin from 5 mg to 3 mg PO Q HS     Orena Cavazos C. Medina-Vargas - NP    [facility] [phone]

## 2017-07-26 ENCOUNTER — Encounter (HOSPITAL_COMMUNITY): Payer: Self-pay

## 2017-07-26 ENCOUNTER — Emergency Department (HOSPITAL_COMMUNITY): Payer: Medicare Other

## 2017-07-26 ENCOUNTER — Inpatient Hospital Stay (HOSPITAL_COMMUNITY)
Admission: EM | Admit: 2017-07-26 | Discharge: 2017-08-04 | DRG: 871 | Disposition: A | Discharge status: Hospice | Payer: Medicare Other | Attending: Family Medicine | Admitting: Family Medicine

## 2017-07-26 DIAGNOSIS — Z515 Encounter for palliative care: Secondary | ICD-10-CM | POA: Diagnosis not present

## 2017-07-26 DIAGNOSIS — G20 Parkinson's disease: Secondary | ICD-10-CM | POA: Diagnosis present

## 2017-07-26 DIAGNOSIS — R627 Adult failure to thrive: Secondary | ICD-10-CM | POA: Diagnosis present

## 2017-07-26 DIAGNOSIS — Z888 Allergy status to other drugs, medicaments and biological substances status: Secondary | ICD-10-CM

## 2017-07-26 DIAGNOSIS — Z9181 History of falling: Secondary | ICD-10-CM

## 2017-07-26 DIAGNOSIS — I214 Non-ST elevation (NSTEMI) myocardial infarction: Secondary | ICD-10-CM | POA: Diagnosis not present

## 2017-07-26 DIAGNOSIS — R1310 Dysphagia, unspecified: Secondary | ICD-10-CM

## 2017-07-26 DIAGNOSIS — F0281 Dementia in other diseases classified elsewhere with behavioral disturbance: Secondary | ICD-10-CM | POA: Diagnosis present

## 2017-07-26 DIAGNOSIS — Z79899 Other long term (current) drug therapy: Secondary | ICD-10-CM | POA: Diagnosis not present

## 2017-07-26 DIAGNOSIS — R296 Repeated falls: Secondary | ICD-10-CM | POA: Diagnosis present

## 2017-07-26 DIAGNOSIS — E039 Hypothyroidism, unspecified: Secondary | ICD-10-CM | POA: Diagnosis present

## 2017-07-26 DIAGNOSIS — T17908S Unspecified foreign body in respiratory tract, part unspecified causing other injury, sequela: Secondary | ICD-10-CM | POA: Diagnosis not present

## 2017-07-26 DIAGNOSIS — Z9889 Other specified postprocedural states: Secondary | ICD-10-CM

## 2017-07-26 DIAGNOSIS — J189 Pneumonia, unspecified organism: Secondary | ICD-10-CM | POA: Diagnosis not present

## 2017-07-26 DIAGNOSIS — Z66 Do not resuscitate: Secondary | ICD-10-CM | POA: Diagnosis not present

## 2017-07-26 DIAGNOSIS — H9190 Unspecified hearing loss, unspecified ear: Secondary | ICD-10-CM | POA: Diagnosis present

## 2017-07-26 DIAGNOSIS — Z820 Family history of epilepsy and other diseases of the nervous system: Secondary | ICD-10-CM

## 2017-07-26 DIAGNOSIS — R778 Other specified abnormalities of plasma proteins: Secondary | ICD-10-CM

## 2017-07-26 DIAGNOSIS — J9690 Respiratory failure, unspecified, unspecified whether with hypoxia or hypercapnia: Secondary | ICD-10-CM

## 2017-07-26 DIAGNOSIS — I429 Cardiomyopathy, unspecified: Secondary | ICD-10-CM | POA: Diagnosis present

## 2017-07-26 DIAGNOSIS — E46 Unspecified protein-calorie malnutrition: Secondary | ICD-10-CM | POA: Diagnosis present

## 2017-07-26 DIAGNOSIS — R0602 Shortness of breath: Secondary | ICD-10-CM

## 2017-07-26 DIAGNOSIS — N189 Chronic kidney disease, unspecified: Secondary | ICD-10-CM | POA: Diagnosis present

## 2017-07-26 DIAGNOSIS — N179 Acute kidney failure, unspecified: Secondary | ICD-10-CM | POA: Diagnosis present

## 2017-07-26 DIAGNOSIS — R748 Abnormal levels of other serum enzymes: Secondary | ICD-10-CM | POA: Diagnosis present

## 2017-07-26 DIAGNOSIS — I4510 Unspecified right bundle-branch block: Secondary | ICD-10-CM | POA: Diagnosis present

## 2017-07-26 DIAGNOSIS — I361 Nonrheumatic tricuspid (valve) insufficiency: Secondary | ICD-10-CM | POA: Diagnosis not present

## 2017-07-26 DIAGNOSIS — Z6821 Body mass index (BMI) 21.0-21.9, adult: Secondary | ICD-10-CM | POA: Diagnosis not present

## 2017-07-26 DIAGNOSIS — I480 Paroxysmal atrial fibrillation: Secondary | ICD-10-CM | POA: Diagnosis not present

## 2017-07-26 DIAGNOSIS — J690 Pneumonitis due to inhalation of food and vomit: Secondary | ICD-10-CM | POA: Diagnosis present

## 2017-07-26 DIAGNOSIS — Z905 Acquired absence of kidney: Secondary | ICD-10-CM | POA: Diagnosis not present

## 2017-07-26 DIAGNOSIS — A419 Sepsis, unspecified organism: Secondary | ICD-10-CM | POA: Diagnosis present

## 2017-07-26 DIAGNOSIS — R0902 Hypoxemia: Secondary | ICD-10-CM

## 2017-07-26 DIAGNOSIS — Z825 Family history of asthma and other chronic lower respiratory diseases: Secondary | ICD-10-CM

## 2017-07-26 DIAGNOSIS — Z87442 Personal history of urinary calculi: Secondary | ICD-10-CM | POA: Diagnosis not present

## 2017-07-26 DIAGNOSIS — N390 Urinary tract infection, site not specified: Secondary | ICD-10-CM | POA: Diagnosis present

## 2017-07-26 DIAGNOSIS — N401 Enlarged prostate with lower urinary tract symptoms: Secondary | ICD-10-CM | POA: Diagnosis present

## 2017-07-26 DIAGNOSIS — I4891 Unspecified atrial fibrillation: Secondary | ICD-10-CM | POA: Diagnosis not present

## 2017-07-26 DIAGNOSIS — G9340 Encephalopathy, unspecified: Secondary | ICD-10-CM | POA: Diagnosis present

## 2017-07-26 DIAGNOSIS — R6521 Severe sepsis with septic shock: Secondary | ICD-10-CM | POA: Diagnosis present

## 2017-07-26 DIAGNOSIS — Y95 Nosocomial condition: Secondary | ICD-10-CM | POA: Diagnosis present

## 2017-07-26 DIAGNOSIS — R7989 Other specified abnormal findings of blood chemistry: Secondary | ICD-10-CM

## 2017-07-26 DIAGNOSIS — D649 Anemia, unspecified: Secondary | ICD-10-CM | POA: Diagnosis present

## 2017-07-26 DIAGNOSIS — Z993 Dependence on wheelchair: Secondary | ICD-10-CM

## 2017-07-26 DIAGNOSIS — L8990 Pressure ulcer of unspecified site, unspecified stage: Secondary | ICD-10-CM | POA: Diagnosis present

## 2017-07-26 HISTORY — DX: Pneumonia, unspecified organism: J18.9

## 2017-07-26 HISTORY — DX: Unspecified atrial fibrillation: I48.91

## 2017-07-26 LAB — I-STAT CG4 LACTIC ACID, ED
LACTIC ACID, VENOUS: 2.55 mmol/L — AB (ref 0.5–1.9)
Lactic Acid, Venous: 1.47 mmol/L (ref 0.5–1.9)

## 2017-07-26 LAB — I-STAT ARTERIAL BLOOD GAS, ED
Acid-base deficit: 6 mmol/L — ABNORMAL HIGH (ref 0.0–2.0)
BICARBONATE: 17.9 mmol/L — AB (ref 20.0–28.0)
O2 Saturation: 97 %
PO2 ART: 85 mmHg (ref 83.0–108.0)
Patient temperature: 97.3
TCO2: 19 mmol/L — ABNORMAL LOW (ref 22–32)
pCO2 arterial: 27.5 mmHg — ABNORMAL LOW (ref 32.0–48.0)
pH, Arterial: 7.42 (ref 7.350–7.450)

## 2017-07-26 LAB — URINALYSIS, ROUTINE W REFLEX MICROSCOPIC
Bilirubin Urine: NEGATIVE
Glucose, UA: NEGATIVE mg/dL
Hgb urine dipstick: NEGATIVE
Ketones, ur: 5 mg/dL — AB
Nitrite: NEGATIVE
Protein, ur: 30 mg/dL — AB
Specific Gravity, Urine: 1.021 (ref 1.005–1.030)
pH: 5 (ref 5.0–8.0)

## 2017-07-26 LAB — COMPREHENSIVE METABOLIC PANEL
ALK PHOS: 87 U/L (ref 38–126)
ALT: 10 U/L — ABNORMAL LOW (ref 17–63)
ANION GAP: 11 (ref 5–15)
AST: 84 U/L — ABNORMAL HIGH (ref 15–41)
Albumin: 3.2 g/dL — ABNORMAL LOW (ref 3.5–5.0)
BUN: 38 mg/dL — ABNORMAL HIGH (ref 6–20)
CO2: 22 mmol/L (ref 22–32)
CREATININE: 1.7 mg/dL — AB (ref 0.61–1.24)
Calcium: 8.7 mg/dL — ABNORMAL LOW (ref 8.9–10.3)
Chloride: 102 mmol/L (ref 101–111)
GFR, EST AFRICAN AMERICAN: 42 mL/min — AB (ref >60)
GFR, EST NON AFRICAN AMERICAN: 36 mL/min — AB (ref >60)
Glucose, Bld: 106 mg/dL — ABNORMAL HIGH (ref 65–99)
Potassium: 4.6 mmol/L (ref 3.5–5.1)
SODIUM: 135 mmol/L (ref 135–145)
TOTAL PROTEIN: 6.4 g/dL — AB (ref 6.5–8.1)
Total Bilirubin: 1.9 mg/dL — ABNORMAL HIGH (ref 0.3–1.2)

## 2017-07-26 LAB — CBC WITH DIFFERENTIAL/PLATELET
Basophils Absolute: 0 10*3/uL (ref 0.0–0.1)
Basophils Relative: 0 %
EOS ABS: 0 10*3/uL (ref 0.0–0.7)
Eosinophils Relative: 0 %
HCT: 38.5 % — ABNORMAL LOW (ref 39.0–52.0)
Hemoglobin: 12.4 g/dL — ABNORMAL LOW (ref 13.0–17.0)
LYMPHS ABS: 1.1 10*3/uL (ref 0.7–4.0)
Lymphocytes Relative: 9 %
MCH: 29.8 pg (ref 26.0–34.0)
MCHC: 32.2 g/dL (ref 30.0–36.0)
MCV: 92.5 fL (ref 78.0–100.0)
MONO ABS: 0.9 10*3/uL (ref 0.1–1.0)
MONOS PCT: 8 %
NEUTROS ABS: 9.7 10*3/uL — AB (ref 1.7–7.7)
Neutrophils Relative %: 83 %
PLATELETS: 292 10*3/uL (ref 150–400)
RBC: 4.16 MIL/uL — AB (ref 4.22–5.81)
RDW: 13.8 % (ref 11.5–15.5)
WBC Morphology: INCREASED
WBC: 11.7 10*3/uL — ABNORMAL HIGH (ref 4.0–10.5)

## 2017-07-26 LAB — I-STAT TROPONIN, ED: Troponin i, poc: 3.12 ng/mL (ref 0.00–0.08)

## 2017-07-26 LAB — PROTIME-INR
INR: 1.35
Prothrombin Time: 16.5 seconds — ABNORMAL HIGH (ref 11.4–15.2)

## 2017-07-26 LAB — STREP PNEUMONIAE URINARY ANTIGEN: STREP PNEUMO URINARY ANTIGEN: NEGATIVE

## 2017-07-26 LAB — BRAIN NATRIURETIC PEPTIDE: B Natriuretic Peptide: 939.5 pg/mL — ABNORMAL HIGH (ref 0.0–100.0)

## 2017-07-26 LAB — D-DIMER, QUANTITATIVE: D-Dimer, Quant: 2.12 ug/mL-FEU — ABNORMAL HIGH (ref 0.00–0.50)

## 2017-07-26 MED ORDER — HYDROCORTISONE NA SUCCINATE PF 100 MG IJ SOLR
50.0000 mg | Freq: Four times a day (QID) | INTRAMUSCULAR | Status: DC
  Administered 2017-07-26 – 2017-07-27 (×2): 50 mg via INTRAVENOUS
  Filled 2017-07-26 (×2): qty 2
  Filled 2017-07-26: qty 1

## 2017-07-26 MED ORDER — ORAL CARE MOUTH RINSE: 15.0000 mL | Freq: Two times a day (BID) | OROMUCOSAL | Status: DC

## 2017-07-26 MED ORDER — PHENYLEPHRINE HCL 10 MG/ML IJ SOLN
0.0000 ug/min | Freq: Once | INTRAVENOUS | Status: DC
  Filled 2017-07-26: qty 1

## 2017-07-26 MED ORDER — ENOXAPARIN SODIUM 30 MG/0.3ML ~~LOC~~ SOLN: 30.0000 mg | SUBCUTANEOUS | Status: DC

## 2017-07-26 MED ORDER — VANCOMYCIN HCL IN DEXTROSE 1-5 GM/200ML-% IV SOLN
1000.0000 mg | INTRAVENOUS | Status: DC
  Administered 2017-07-27 – 2017-07-29 (×3): 1000 mg via INTRAVENOUS
  Filled 2017-07-26 (×5): qty 200

## 2017-07-26 MED ORDER — DILTIAZEM HCL 100 MG IV SOLR
5.0000 mg/h | Freq: Once | INTRAVENOUS | Status: DC
  Filled 2017-07-26: qty 100

## 2017-07-26 MED ORDER — AMIODARONE HCL IN DEXTROSE 360-4.14 MG/200ML-% IV SOLN: 60.0000 mg/h | INTRAVENOUS | Status: DC

## 2017-07-26 MED ORDER — DEXTROSE 5 % IV SOLN
500.0000 mg | INTRAVENOUS | Status: DC
  Administered 2017-07-26: 500 mg via INTRAVENOUS
  Filled 2017-07-26: qty 500

## 2017-07-26 MED ORDER — DEXTROSE 5 % IV SOLN
1.0000 g | INTRAVENOUS | Status: DC
  Administered 2017-07-27 – 2017-07-29 (×3): 1 g via INTRAVENOUS
  Filled 2017-07-26 (×4): qty 1

## 2017-07-26 MED ORDER — AMIODARONE LOAD VIA INFUSION
150.0000 mg | Freq: Once | INTRAVENOUS | Status: DC
  Filled 2017-07-26: qty 83.34

## 2017-07-26 MED ORDER — HEPARIN SODIUM (PORCINE) 5000 UNIT/ML IJ SOLN
5000.0000 [IU] | Freq: Three times a day (TID) | INTRAMUSCULAR | Status: DC
  Administered 2017-07-26: 5000 [IU] via SUBCUTANEOUS
  Filled 2017-07-26: qty 1

## 2017-07-26 MED ORDER — AMIODARONE HCL IN DEXTROSE 360-4.14 MG/200ML-% IV SOLN: 30.0000 mg/h | INTRAVENOUS | Status: DC

## 2017-07-26 MED ORDER — SODIUM CHLORIDE 0.9 % IV BOLUS (SEPSIS)
250.0000 mL | Freq: Once | INTRAVENOUS | Status: AC
  Administered 2017-07-26: 250 mL via INTRAVENOUS

## 2017-07-26 MED ORDER — VANCOMYCIN HCL IN DEXTROSE 1-5 GM/200ML-% IV SOLN
1000.0000 mg | Freq: Once | INTRAVENOUS | Status: AC
  Administered 2017-07-26: 1000 mg via INTRAVENOUS
  Filled 2017-07-26: qty 200

## 2017-07-26 MED ORDER — SODIUM CHLORIDE 0.9 % IV SOLN
0.0000 ug/min | INTRAVENOUS | Status: DC
  Administered 2017-07-26: 20 ug/min via INTRAVENOUS
  Filled 2017-07-26 (×2): qty 1

## 2017-07-26 MED ORDER — SODIUM CHLORIDE 0.9 % IV BOLUS (SEPSIS)
1000.0000 mL | Freq: Once | INTRAVENOUS | Status: AC
  Administered 2017-07-26: 1000 mL via INTRAVENOUS

## 2017-07-26 MED ORDER — ASPIRIN 81 MG PO CHEW
324.0000 mg | CHEWABLE_TABLET | Freq: Once | ORAL | Status: AC
  Administered 2017-07-26: 324 mg via ORAL
  Filled 2017-07-26: qty 4

## 2017-07-26 MED ORDER — SODIUM CHLORIDE 0.9 % IV SOLN
Freq: Once | INTRAVENOUS | Status: AC
  Administered 2017-07-26: 17:00:00 via INTRAVENOUS

## 2017-07-26 MED ORDER — SODIUM CHLORIDE 0.9 % IV SOLN: 250.0000 mL | INTRAVENOUS | Status: DC | PRN

## 2017-07-26 MED ORDER — VANCOMYCIN HCL 500 MG IV SOLR
500.0000 mg | Freq: Once | INTRAVENOUS | Status: AC
  Administered 2017-07-26: 500 mg via INTRAVENOUS
  Filled 2017-07-26: qty 500

## 2017-07-26 MED ORDER — CEFEPIME HCL 2 G IJ SOLR
2.0000 g | Freq: Once | INTRAMUSCULAR | Status: AC
  Administered 2017-07-26: 2 g via INTRAVENOUS
  Filled 2017-07-26: qty 2

## 2017-07-26 MED ORDER — SODIUM CHLORIDE 0.9 % IV SOLN: INTRAVENOUS | Status: DC

## 2017-07-26 MED ORDER — CEFEPIME HCL 1 G IJ SOLR: 1.0000 g | INTRAMUSCULAR | Status: DC

## 2017-07-26 NOTE — Progress Notes (Signed)
Pharmacy Antibiotic Note  Martin Lowe is a 83 y.o. male admitted on 07/26/2017 with pneumonia from rehab facility. Tm 102 prior to admission. LA 2.5, SCr 1.7, baseline SCr appears to be 1.2-1.3. Starting broad spectrum abx. Has already received cefepime 2 g IV x1 in the ED.   Plan: -Vancomycin 1500 mg IV x1 then 1 g IV q24h -Cefepime 1 g IV q24h -Monitor renal fx, cultures, VT as needed  Height:  (177.8 cm) Weight: 156 lb (70.8 kg) IBW/kg (Calculated) : 73  Temp (24hrs), Avg:98 F (36.7 C), Min:97.7 F (36.5 C), Max:98.3 F (36.8 C)   Recent Labs Lab 07/26/17 1558  LATICACIDVEN 2.55*    CrCl cannot be calculated (Patient's most recent lab result is older than the maximum 21 days allowed.).    Allergies  Allergen Reactions  . Ciprofloxacin Hives and Rash  . Nitrofurantoin Rash    Antimicrobials this admission: 9/20 vancomycin > 9/20 cefepime >  Dose adjustments this admission: N/A  Microbiology results: 9/20 blood cx:   Martin Lowe 07/26/2017 4:20 PM

## 2017-07-26 NOTE — Consult Note (Signed)
Cardiology Consultation:   Patient ID: Martin Lowe; [id number]; 08-Nov-1933   Admit date: 07/26/2017 Date of Consult: 07/26/2017  Primary Care Provider: No primary care provider on file. Primary Cardiologist: New   Patient Profile:   Martin Lowe is a 83 y.o. male with a hx of Parkinson's disease, BPH, dementia, failure to thrive, and frequent falls residing in hospice home who is being seen today for the evaluation of atrial fib and elevated troponin at the request of Dr. Donnald Garre.  History of Present Illness:   Mr. Martin Lowe Presented to [facility] today for fever and cough from [facility]. The patient does not communicate. He had a fever of 102 this am. His chest xray shows multifocal pneumonia. Lactic acid is elevated. The patient has a history of Parkinson's and dementia. He has reportedly not been eating and has had a decrease in his activity level. The family reports that the patient has not been himself for the past 2 days, more confused and fatigued. He has frequent falls and had a fall last night. He was found to be in atrial fibrillation with rapid ventricular response. He has no knows history of atrial fib.  I see no documented cardiac disease.   The patient Was not able to give any history. I was able to get some history from the family.  Significant findings: EKG: atrial fib at 179 bpm with RBBB and LPFB, ant lateral ST depression. No STEMI CXR: Patchy airspace disease bilaterally, right greater than left, most consistent with multifocal pneumonia. SCr 1.7  (was 1.3 in 03/2017) K+ 4.6 BNP  939.5 Troponin  3.12 Lactic acid 2.55     Past Medical History:  Diagnosis Date  . Atrial fibrillation (HCC)   . BPH (benign prostatic hyperplasia)   . Dementia   . Failure to thrive in adult   . Frequent falls   . Hypothyroidism   . Kidney stone   . Parkinson disease (HCC)   . Urinary incontinence     Past Surgical History:  Procedure Laterality Date  . BLADDER  SURGERY    . HERNIA REPAIR    . NEPHRECTOMY    . PROSTATE SURGERY       Home Medications:  Prior to Admission medications   Medication Sig Start Date End Date Taking? Authorizing Provider  acetaminophen (TYLENOL) 325 MG tablet Take 650 mg by mouth every 4 (four) hours as needed for mild pain.    [provider]  carbidopa-levodopa (SINEMET IR) 25-100 MG tablet Take 1 tablet by mouth 3 (three) times daily. Take at 8A, 11A, 2P    [provider]  carbidopa-levodopa (SINEMET IR) 25-250 MG tablet Take by mouth. Take @ 8 am, 11 am, 2 pm, 5 pm and 8 pm.   Take 1/2 tab at 10 pm.    [provider]  cholecalciferol (VITAMIN D) 1000 units tablet Take 2,000 Units by mouth daily. 2 tabs to = 2000 units    [provider]  Melatonin 5 MG TABS Take 1 tablet by mouth at bedtime. Give 1/2 hour prior to bedtime    [provider]  Pimavanserin Tartrate (NUPLAZID) 17 MG TABS Take 34 mg by mouth every morning.     [provider]  Polyethylene Glycol 3350 (MIRALAX PO) Take 17 g by mouth every morning.    [provider]  Psyllium (METAMUCIL FIBER PO) Take 1 scoop by mouth daily.    [provider]  QUEtiapine (SEROQUEL) 100 MG tablet Take 100  mg by mouth at bedtime.    [provider]  solifenacin (VESICARE) 10 MG tablet Take 10 mg by mouth at bedtime.     [provider]  tamsulosin (FLOMAX) 0.4 MG CAPS capsule Take 0.4 mg by mouth at bedtime.    [provider]  UNABLE TO FIND Med Name: Lavender oil. Apply one drop of lavender oil behind each ear    [provider]  UNABLE TO FIND Med Name: Mighty shake by mouth daily    [provider]    Inpatient Medications: Scheduled Meds:  Continuous Infusions: . diltiazem (CARDIZEM) infusion    . vancomycin 500 mg (07/26/17 1716)   PRN Meds:   Allergies:    Allergies  Allergen Reactions  . Ciprofloxacin Hives and Rash  . Nitrofurantoin  Rash    Social History:   Social History   Social History  . Marital status: Married    Spouse name: N/A  . Number of children: N/A  . Years of education: N/A   Occupational History  . Not on file.   Social History Main Topics  . Smoking status: Never Smoker  . Smokeless tobacco: Never Used  . Alcohol use No  . Drug use: No  . Sexual activity: Not on file   Other Topics Concern  . Not on file   Social History Narrative  . No narrative on file    Family History:    Family History  Problem Relation Age of Onset  . Alzheimer's disease Mother   . COPD Father      ROS:  Please see the history of present illness.  ROS  The review of systems is unobtainable because of the patient's dementia  Physical Exam/Data:   Vitals:   07/26/17 1630 07/26/17 1641 07/26/17 1645 07/26/17 1700  BP: (!) 79/63 95/64 109/87 121/88  Pulse: (!) 145   (!) 120  Resp: (!) 22 (!) 24 (!) 21 20  Temp:      TempSrc:      SpO2: 90%   94%  Weight:      Height:        Intake/Output Summary (Last 24 hours) at 07/26/17 1749 Last data filed at 07/26/17 1644  Gross per 24 hour  Intake             2300 ml  Output                0 ml  Net             2300 ml   Filed Weights   07/26/17 1545  Weight: 156 lb (70.8 kg)   Body mass index is 22.38 kg/m.  General:  Chronically ill-appearing gentleman, There is obvious loss of muscle mass with apparent malnutrition HEENT: normal Lymph: no adenopathy Neck: no JVD Endocrine:  No thryomegaly Vascular: No carotid bruits; FA pulses 2+ bilaterally without bruits  Cardiac:  Regular rate. Normal S1-S2. He has a soft systolic murmur Lungs:  Rales and rhonchi particularly on the right side Abd: soft, nontender, no hepatomegaly  Ext: no edema,  he has severe muscle wasting. Musculoskeletal:  No deformities, BUE and BLE strength normal and equal Skin: warm and dry  Neuro:  Unable to assess his cranial nerves. He is unresponsive. Psych:  Unable to  assess   EKG:  The EKG was personally reviewed and demonstrates:  atrial fib at 179 bpm with RBBB and LPFB, ant lateral ST depression. No STEMI  Telemetry:  Telemetry was  personally reviewed and demonstrates:  Normal sinus rhythm with a heart rate of 68.  Relevant CV Studies:  None  Laboratory Data:  Chemistry Recent Labs Lab 07/26/17 1546  NA 135  K 4.6  CL 102  CO2 22  GLUCOSE 106*  BUN 38*  CREATININE 1.70*  CALCIUM 8.7*  GFRNONAA 36*  GFRAA 42*  ANIONGAP 11     Recent Labs Lab 07/26/17 1546  PROT 6.4*  ALBUMIN 3.2*  AST 84*  ALT 10*  ALKPHOS 87  BILITOT 1.9*   Hematology Recent Labs Lab 07/26/17 1546  WBC 11.7*  RBC 4.16*  HGB 12.4*  HCT 38.5*  MCV 92.5  MCH 29.8  MCHC 32.2  RDW 13.8  PLT 292   Cardiac EnzymesNo results for input(s): TROPONINI in the last 168 hours.  Recent Labs Lab 07/26/17 1558  TROPIPOC 3.12*    BNP Recent Labs Lab 07/26/17 1546  BNP 939.5*    DDimer No results for input(s): DDIMER in the last 168 hours.  Radiology/Studies:  Dg Chest Port 1 View  Result Date: 07/26/2017 CLINICAL DATA:  Sepsis EXAM: PORTABLE CHEST 1 VIEW COMPARISON:  CT chest of 11/22/2015 and chest x-ray of the same day FINDINGS: There is patchy airspace disease bilaterally right greater than left. No definite effusion is seen. These changes most likely reflect multifocal pneumonia with congestive heart failure less likely. The heart is within upper limits normal. No acute bony abnormality is seen. IMPRESSION: Patchy airspace disease bilaterally, right greater than left, most consistent with multifocal pneumonia. Electronically Signed   By: Dwyane Dee M.D.   On: 07/26/2017 17:08    Assessment and Plan:   1. Atrial fibrillation -Pt admitted for pneumonia and found to be in atrial fibrillation with RVR -No hx of cardiac disease or atrial fib. Pt has history of severe Parkinson's disease and dementia. He does not communicate at baseline. He is  unresponsive this evening.  The patient converted from atrial fibrillation to normal sinus rhythm after receiving IV antibiotics and IV normal saline. I discussed with the emergency room doctors the possibility of starting amiodarone. I would not give amiodarone at this point. I would also not give IV heparin. His CHADS2VASC score is 2 ( age)  He has a history of frequent falls. I would not consider him a candidate for chronic anticoagulation.  2. Non-ST segment elevation myocardial infarction: His troponin is 3.12. This quite likely is due to an demand ischemia with his rapid atrial fibrillation and sepsis syndrome. He also has a creatinine of 1.7 which is significantly high given his low muscle mass. We'll get an echo card gram tomorrow for further assessment. He is not a candidate for invasive or interventional procedures. I discussed this fact with the family.  3. Pneumonia with sepsis syndrome: Plans per internal medicine team or PCCM  4. Acute renal insufficiency: He's getting IV fluids. His oral intake has been reduced for the past several days.  His prognosis is poor.    For questions or updates, please contact [facility] Please consult [email or web address] for contact info under Cardiology/STEMI.  Kristeen Miss, MD  07/26/2017 6:51 PM    [facility] [address] [place],  [address] [place], [place]  [address] Pager [phone] Phone: [phone]; Fax: [phone]

## 2017-07-26 NOTE — ED Triage Notes (Signed)
Per EMS, pt from [facility] after being diagnosed with pneumonia. Pt had chest x-ray there. In route pt's HR between 140-210 a-fib and pt has no hx of a-fib. Pt had fever of 102 this morning at facility and was given tylenol. Pt alert and has hx of parkinsons and dementia.

## 2017-07-26 NOTE — Consult Note (Addendum)
Consult Note   Margie Brink QMV:[id number] DOB: 1933-11-08 DOA: 07/26/2017  PCP: [facility] physician Consultants:  Lorin Picket - Neurology, [facility] Patient coming from: [facility] ; NOK: Wife, daughter, son; [phone]  Chief Complaint: fever  HPI: Martin Lowe is a 83 y.o. male with medical history significant of advanced Parkinson's with dementia; afib not on anticoagulation; hypothyroidism; and BPH.  He has had a deep cough for a week or so but he coughs a lot anyway.  Tuesday night, he was very out of it and was shaking.  The nurse checked his vitals and they were ok.  They were going to have the doctor see him Wednesday AM but no one did.  Last night about 1130 he fell - they got him up and kept him in the nursing station to protect him from additional falls.  His wife arrived at 10am today and the nurse reported that "he's sick" - fever, oxygen low.  He was given tylenol and put on oxygen.  He was out of it and unable to take PO antibiotic.  He does eat but his swallow is becoming more concerning.  + PNA on CXR.  T max 102.9.  Unable to produce sputum.  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  Weight loss 20 pounds in 6 months.    They have discussed advanced directives.  The family reports that his desires are for DNR; that he has a living will; and that he has stated he would not desire a feeding tube.     ED Course: Initially with afib with RVR - Dr. Elease Hashimoto consulted and recommended Amiodarone bolus and drip with heparin therapy - but patient spontaneously converted and both of these therapies were held.  Elevated troponin thought to be due to demand ischemia.  Also with sepsis - fluid resuscitated and given Cefepime/Vanc.  Became hypotensive, started on Neosynephrine.  Given hypotension and need for pressors, PCCM also consulted.  Review of Systems: Unable to perform   Past  Medical History:  Diagnosis Date  . Atrial fibrillation (HCC)   . BPH (benign prostatic hyperplasia)   . Dementia   . Failure to thrive in adult   . Frequent falls   . Hypothyroidism   . Kidney stone   . Parkinson disease (HCC)   . Urinary incontinence     Past Surgical History:  Procedure Laterality Date  . BLADDER SURGERY    . HERNIA REPAIR    . NEPHRECTOMY    . PROSTATE SURGERY      Social History   Social History  . Marital status: Married    Spouse name: N/A  . Number of children: N/A  . Years of education: N/A   Occupational History  . Not on file.   Social History Main Topics  . Smoking status: Never Smoker  . Smokeless tobacco: Never Used  . Alcohol use No  . Drug use: No  . Sexual activity: Not on file   Other Topics Concern  . Not on file   Social History Narrative  . No narrative on file    Allergies  Allergen Reactions  . Ciprofloxacin Hives and Rash  . Nitrofurantoin Rash    Family History  Problem Relation Age of Onset  . Alzheimer's disease Mother   . COPD Father     Prior to Admission medications   Medication Sig Start Date End Date Taking? Authorizing  Provider  acetaminophen (TYLENOL) 325 MG tablet Take 650 mg by mouth every 4 (four) hours as needed for mild pain.    [provider]  carbidopa-levodopa (SINEMET IR) 25-100 MG tablet Take 1 tablet by mouth 3 (three) times daily. Take at 8A, 11A, 2P    [provider]  carbidopa-levodopa (SINEMET IR) 25-250 MG tablet Take by mouth. Take @ 8 am, 11 am, 2 pm, 5 pm and 8 pm.   Take 1/2 tab at 10 pm.    [provider]  cholecalciferol (VITAMIN D) 1000 units tablet Take 2,000 Units by mouth daily. 2 tabs to = 2000 units    [provider]  Melatonin 5 MG TABS Take 1 tablet by mouth at bedtime. Give 1/2 hour prior to bedtime    [provider]  Pimavanserin Tartrate (NUPLAZID) 17 MG TABS Take 34 mg by mouth every morning.     [provider]  Polyethylene Glycol 3350 (MIRALAX PO) Take 17 g by mouth every morning.    [provider]  Psyllium (METAMUCIL FIBER PO) Take 1 scoop by mouth daily.    [provider]  QUEtiapine (SEROQUEL) 100 MG tablet Take 100 mg by mouth at bedtime.    [provider]  solifenacin (VESICARE) 10 MG tablet Take 10 mg by mouth at bedtime.     [provider]  tamsulosin (FLOMAX) 0.4 MG CAPS capsule Take 0.4 mg by mouth at bedtime.    [provider]  UNABLE TO FIND Med Name: Lavender oil. Apply one drop of lavender oil behind each ear    [provider]  UNABLE TO FIND Med Name: Mighty shake by mouth daily    [provider]    Physical Exam: Vitals:   07/26/17 2227 07/26/17 2234 07/26/17 2245 07/27/17 0016  BP:  109/71 90/69   Pulse:  67 71   Resp:  (!) 22 (!) 25   Temp:    (!) 97.5 F (36.4 C)  TempSrc:    Axillary  SpO2:  98% 94%   Weight: 67.5 kg (148 lb 13 oz)     Height:  (1.778 m)        General: Obtunded, minimally responsive to painful stimuli Eyes: Closed, normal lids,  ENT:  mmm Neck:  no LAD, masses or thyromegaly; no carotid bruits Cardiovascular:  RRR, no m/r/g. No LE edema.  Respiratory:  LLE rhonchi. Normal respiratory effort. Abdomen:  soft, apparently NT, ND, NABS Skin:  no rash or induration seen on limited exam Musculoskeletal:  grossly normal tone BUE/BLE, good passiveROM, no bony abnormality Psychiatric:obtunded Neurologic: unable to perform    Radiological Exams on Admission: Ct Head Wo Contrast  Result Date: 07/26/2017 CLINICAL DATA:  Minor head trauma. Fever. Parkinson's disease and dementia. EXAM: CT HEAD WITHOUT CONTRAST TECHNIQUE: Contiguous axial images were obtained from the base of the skull through the vertex without intravenous contrast. COMPARISON:  Head CT 11/22/2015 FINDINGS: Brain: No mass lesion, intraparenchymal hemorrhage or extra-axial collection. No evidence of acute  cortical infarct. There is periventricular hypoattenuation compatible with chronic microvascular disease. There is generalized atrophy, unchanged from the prior study. Vascular: No hyperdense vessel or unexpected calcification. Skull: No calvarial fracture. There is multifocal soft tissue emphysema within the bilateral masticator spaces, left parotid space and in the left posterior paravertebral soft tissues. There are also foci of gas within the epidural venous plexus. Sinuses/Orbits: There is complete opacification of the right frontal and ethmoid sinuses. Normal orbits.  IMPRESSION: 1. No acute intracranial abnormality. 2. Generalized atrophy with chronic ischemic microangiopathy. 3. Multiple foci of soft tissue gas within the bilateral subcutaneous fascial tissues, bilateral masticator spaces, left parotid space and in the epidural venous plexus. This is of uncertain etiology. Is there a history of recent trauma? Given the presence of air within the epidural venous plexus it is possible that the other foci of gas are also within very small veins, in which case it may be secondary to IV infusion. Electronically Signed   By: Deatra Robinson M.D.   On: 07/26/2017 22:44   Dg Chest Port 1 View  Result Date: 07/26/2017 CLINICAL DATA:  Sepsis EXAM: PORTABLE CHEST 1 VIEW COMPARISON:  CT chest of 11/22/2015 and chest x-ray of the same day FINDINGS: There is patchy airspace disease bilaterally right greater than left. No definite effusion is seen. These changes most likely reflect multifocal pneumonia with congestive heart failure less likely. The heart is within upper limits normal. No acute bony abnormality is seen. IMPRESSION: Patchy airspace disease bilaterally, right greater than left, most consistent with multifocal pneumonia. Electronically Signed   By: Dwyane Dee M.D.   On: 07/26/2017 17:08    EKG: Independently reviewed.  1611:  Afib with rate 179; RBBB and LPFB with rate-related changes. 2152: NSR with  rate 64, nonspecific ST changes with no evidence of acute ischemia   Labs on Admission: I have personally reviewed the available labs and imaging studies at the time of the admission.  Pertinent labs:   ABG 7.420/27.5/85/17.9 D-dimer 2.12 Lactate 2.55, 1.47 Troponin 3.12 BNP 939.5 UA: 5 ketones, moderate LE, 30 protein, many bacteria, TNTC WBC Negative strep pneumoniae Glucose 106 BUN 38/Creatinine 1.70/GFR 36 Albumin 3.2 AST 84/ALT 10/Bili 1.9 WBC 11.7 Hgb 12.4 INR 1.35  Assessment/Plan Principal Problem:   HCAP (healthcare-associated pneumonia) Active Problems:   Dementia due to Parkinson's disease with behavioral disturbance (HCC)   Sepsis (HCC)   Troponin level elevated   Neuro/Psych:  Patient with advanced Parkinson's and dementia at baseline and poor quality of life per family. -He is currently obtunded and not interactive at all.  CV:  Afib with RVR on presentation to the ER which resolved spontaneously prior to initiation of Amiodarone or Heparin.  These medications were held as a result. -Currently in NSR. -Markedly elevated troponin, likely demand ischemia.   -Suggest trending troponin without further intervention, as he is very unlikely to be a candidate for this. -Elevated BNP but fairly low suspicion for CHF as cause of his symptoms at this time. -Would not be a good candidate for long-term anticoagulation in the setting of afib. -Hypotensive likely resulting from severe sepsis.  On pressors.  Unlikely to sustain long-term peripheral pressors so I held a long discussion with family about CVL placement and whether the patient would desire this.  The family was very uncertain.  Pulm:  Acute hypoxic respiratory failure resulting from multifocal PNA. -Based on family's report, the patient is at high risk for aspiration and so would cover with Vanc/Zosyn (rather than Cefepime). -Strep pneumo negative, Legionella testing pending -[place] O2 support for now -Elevated  D-dimer, CTA should be considered. -When we spoke, the family reported that the patient had requested to be a DNR. -CURB 65 score is 5, indicating a 40% mortality rate. -Pneumonia Severity Index score is 221, Class V, mortality risk 27%.  GI:  Strongly suspect aspiration -We discussed the risks/benefits of feeding tubes. -Initially in our discussion, the family mentioned that the patient  would not desire this.  However, in further discussion it appeared that the family may still consider this if offered. -Feeding tube would not protect from aspiration and is not likely to improve morbidity or mortality; would not recommend this.  GU:  H/o nephrolithiasis but no current concerns  Renal:  Acute renal failure on CKD, as baseline creatinine appears to be about 1.1, currently 1.7 -Hydrate and follow  FEN:  Malnutrition, may benefit from nutrition consult. -Elevated LFTs, possibly from mild shock liver.  Endo:  Very mild hyperglycemia, likely acute reaction.  Monitor with fasting labs. -Unlikely to need acute or chronic treatment.  Heme/Onc: Mild anemia, likely chronic. -INR at upper limit of normal.  ID:  Sepsis -Elevated WBC count, fever, tachycardia with elevated lactate and hypotension -While awaiting blood cultures, this appears to be severe sepsis with shock. -Sepsis protocol initiated -Blood and urine cultures pending -Trend lactate to ensure improvement  MSK:  Overall poor conditioning but he is a fall risk  Derm: No acute issues   Patient was seen by PCCM after my evaluation and discussion with family.  Due to the need for pressors, they requested to admit patient to their service in the ICU.  [facility] will be happy to assume care of the patient upon transfer from the ICU.  Total critical care time: 65 minutes Critical care time was exclusive of separately billable procedures and treating other patients. Critical care was necessary to treat or prevent imminent or  life-threatening deterioration. Critical care was time spent personally by me on the following activities: development of treatment plan with patient and/or surrogate as well as nursing, discussions with consultants, evaluation of patient's response to treatment, examination of patient, obtaining history from patient or surrogate, ordering and performing treatments and interventions, ordering and review of laboratory studies, ordering and review of radiographic studies, pulse oximetry and re-evaluation of patient's condition.  DVT prophylaxis: Lovenox Code Status:  DNR - confirmed with family Family Communication: Wife and 2 children present throughout evaluation Disposition Plan: Back to SNF once clinically improved Consults called: PCCM Admission status: Admit - It is my clinical opinion that admission to INPATIENT is reasonable and necessary because this patient will require at least 2 midnights in the hospital to treat this condition based on the medical complexity of the problems presented.  Given the aforementioned information, the predictability of an adverse outcome is felt to be significant.    Jonah Blue MD [facility]  If note is complete, please contact covering daytime or nighttime physician. [email or web address] Password [email or web address]  07/27/2017, 12:21 AM

## 2017-07-26 NOTE — ED Notes (Signed)
This RN is having a hard time reading a SPO2 on Pt.  Multiple probes have been placed on different finger and ear lobes.  Pt sats appear to be in the high 80's. Pt placed on 2L [place]. MD made aware.

## 2017-07-26 NOTE — H&P (Addendum)
PULMONARY / CRITICAL CARE MEDICINE   Name: Martin Lowe MRN: [id number] DOB: May 10, 1934    ADMISSION DATE:  07/26/2017 CONSULTATION DATE:  07/26/2017  REFERRING MD:  Dr. Donnald Lowe  CHIEF COMPLAINT:  Fever, cough  HISTORY OF PRESENT ILLNESS:  HPI obtained from medical chart review and patient's wife, daughter, and son at the bedside due to acute encephalopathy.   83 year old with past medical history of Parkinson's disease, BPH, dementia, hypothyroidism, failure to thrive, urinary incontinence, HOH, and frequent falls who presented to [facility] ER with complaints of fever and cough.   Patient is a resident at [facility] since March 2018.  He goes by "Martin Lowe".  He is normally able to communicate but confused and uses a wheelchair for mobilization but often attempts to walk causing him to have multiple falls.  His family reports increased dysphagia and oral intake since Tuesday and today with increased lethargy.  Noted to have a 102.7 fever and CXR performed at SNF reported a right pneumonia.  He was sent to the ER for further treatment.  In the ER, patient was found to be in rapid Afib w/ RVR and initially normotensive, oxygen sats 83% on RA, and febrile.  CXR noted for multifocal pneumonia right worse than left.  Head CT negative for acute process.  Labs significant for WBC 11.7, LA 2.55 -> 1.48, sCr 1.7, +UTI, BNP 939, and troponin 3.12 with EKG showing diffuse ST depression and Rapid Afib.  He was treated with 3L NS bolus, vancomycin and cefepime.  Cardiology consulted.  He spontaneously converted to NSR in the 70's but progressively became more hypotensive requiring low-dose vasopressor.   Therefore, PCCM to admit.    PAST MEDICAL HISTORY :  He  has a past medical history of Atrial fibrillation (HCC); BPH (benign prostatic hyperplasia); Dementia; Failure to thrive in adult; Frequent falls; Hypothyroidism; Kidney stone; Parkinson disease (HCC); and Urinary incontinence.  PAST SURGICAL HISTORY: He   has a past surgical history that includes Hernia repair; Bladder surgery; Nephrectomy; and Prostate surgery.  Allergies  Allergen Reactions  . Ciprofloxacin Hives and Rash  . Nitrofurantoin Rash    No current facility-administered medications on file prior to encounter.    Current Outpatient Prescriptions on File Prior to Encounter  Medication Sig  . acetaminophen (TYLENOL) 325 MG tablet Take 650 mg by mouth every 4 (four) hours as needed for mild pain.  . carbidopa-levodopa (SINEMET IR) 25-100 MG tablet Take 1 tablet by mouth 3 (three) times daily. Take at 8A, 11A, 2P  . carbidopa-levodopa (SINEMET IR) 25-250 MG tablet Take by mouth. Take @ 8 am, 11 am, 2 pm, 5 pm and 8 pm.   Take 1/2 tab at 10 pm.  . cholecalciferol (VITAMIN D) 1000 units tablet Take 2,000 Units by mouth daily. 2 tabs to = 2000 units  . Melatonin 5 MG TABS Take 1 tablet by mouth at bedtime. Give 1/2 hour prior to bedtime  . Pimavanserin Tartrate (NUPLAZID) 17 MG TABS Take 34 mg by mouth every morning.   . Polyethylene Glycol 3350 (MIRALAX PO) Take 17 g by mouth every morning.  . Psyllium (METAMUCIL FIBER PO) Take 1 scoop by mouth daily.  . QUEtiapine (SEROQUEL) 100 MG tablet Take 100 mg by mouth at bedtime.  . solifenacin (VESICARE) 10 MG tablet Take 10 mg by mouth at bedtime.   . tamsulosin (FLOMAX) 0.4 MG CAPS capsule Take 0.4 mg by mouth at bedtime.  Marland Kitchen UNABLE TO FIND Med Name: Lavender oil. Apply  one drop of lavender oil behind each ear  . UNABLE TO FIND Med Name: Mighty shake by mouth daily    FAMILY HISTORY:  His indicated that the status of his mother is unknown. He indicated that the status of his father is unknown.    SOCIAL HISTORY: He  reports that he has never smoked. He has never used smokeless tobacco. He reports that he does not drink alcohol or use drugs.  REVIEW OF SYSTEMS:   Unable   SUBJECTIVE:  On Phenylphrine at 20 mcg/min  VITAL SIGNS: BP 93/70   Pulse 68   Temp 98.3 F (36.8 C)  (Rectal)   Resp 17   Ht  (1.778 m)   Wt 156 lb (70.8 kg)   SpO2 96%   BMI 22.38 kg/m   HEMODYNAMICS:    VENTILATOR SETTINGS:    INTAKE / OUTPUT: I/O last 3 completed shifts: In: 2600 [IV Piggyback:2600] Out: -   PHYSICAL EXAMINATION: General:  Thin elderly male lying in ER stretcher in NAD HEENT: [place]/AT, MM pink/dry, unable to exam pupils due to patient squinting, no JVD Neuro:  Does not open eyes, but grimaces to noxious stimuli, localizes, MAE CV:  rrr, difficult to assess heart sounds over rhonchi PULM: even/non-labored, RR 20,  R ronchi, left clear w/faint basilar rales GI: soft, non-tender, bs active Extremities: warm/dry, no edema  Skin: no rashes, adult diaper in place  LABS:  BMET  Recent Labs Lab 07/26/17 1546  NA 135  K 4.6  CL 102  CO2 22  BUN 38*  CREATININE 1.70*  GLUCOSE 106*    Electrolytes  Recent Labs Lab 07/26/17 1546  CALCIUM 8.7*    CBC  Recent Labs Lab 07/26/17 1546  WBC 11.7*  HGB 12.4*  HCT 38.5*  PLT 292    Coag's  Recent Labs Lab 07/26/17 1546  INR 1.35    Sepsis Markers  Recent Labs Lab 07/26/17 1558 07/26/17 1922  LATICACIDVEN 2.55* 1.47    ABG No results for input(s): PHART, PCO2ART, PO2ART in the last 168 hours.  Liver Enzymes  Recent Labs Lab 07/26/17 1546  AST 84*  ALT 10*  ALKPHOS 87  BILITOT 1.9*  ALBUMIN 3.2*    Cardiac Enzymes No results for input(s): TROPONINI, PROBNP in the last 168 hours.  Glucose No results for input(s): GLUCAP in the last 168 hours.  Imaging Dg Chest Port 1 View  Result Date: 07/26/2017 CLINICAL DATA:  Sepsis EXAM: PORTABLE CHEST 1 VIEW COMPARISON:  CT chest of 11/22/2015 and chest x-ray of the same day FINDINGS: There is patchy airspace disease bilaterally right greater than left. No definite effusion is seen. These changes most likely reflect multifocal pneumonia with congestive heart failure less likely. The heart is within upper limits normal. No  acute bony abnormality is seen. IMPRESSION: Patchy airspace disease bilaterally, right greater than left, most consistent with multifocal pneumonia. Electronically Signed   By: Dwyane Dee M.D.   On: 07/26/2017 17:08   STUDIES:  CT head 9/20 >> TTE >>>  CULTURES: 9/20 BCx 2 >> 9/20 UC >> sputum  ANTIBIOTICS: Vancomycin 9/20 >> Cefepime 9/20 >> Azithromycin 9/20 >>  SIGNIFICANT EVENTS: 9/20 Admit  LINES/TUBES: PIV x 2   DISCUSSION: 50 yoM from SNF w/PMH of parkinson's and dementia who presented with fever, cough x 1 day, and difficulty swallowing, poor po intake, and increasing lethargy since Tuesday found to have multifocal PNA, UTI and rapid Afib- now SR .  Required low pressors after  3L NS  ASSESSMENT / PLAN:  PULMONARY A: Multifocal PNA +/- Aspiration +/- HCAP Dysphagia R/o PE (Wells score 6) - concerning given O2 needs, RBBB, Afib w/RVR P:   At risk for intubation secondary to mental status Continue supplemental O2 for sats >92% ABG now Trend CXR See ID Will need SLP when appropriate  Check D-Dimer, if positive will need heparin and VQ scan  CARDIOVASCULAR A:  Afib w/RVR - new, converted to SR after IVF Shock- likely septic +/- r/o component of cardiogenic Positive troponin with abnormal EKG/ RBBB- possible r/t demand ischemia rapid rate P:  Tele monitoring Cardiology following with recs for no heparin for CHADS2VASC score of 2 (age) TTE ordered Recheck EKG post conversion to SR Trend Troponin's x 3 Not a candidate for chronic anticoagulation given fall risk/hx Neo for MAP > 65 Checking cortisol level and start stress dose steroids Check Mag and TSH  RENAL A:   AKI- baseline sCr 1.3 (5/18) Lactic acidosis- clearing  UTI Hx BPH, urinary incontinence  P:   Hold further IVFs Trend BMP / mag/ phos urinary output/ daily weights Replace electrolytes as indicated Place condom cath, watch I/Os for urinary retention See ID  Hold pre-admit vesicare and  flomax   GASTROINTESTINAL A:   Failure to thrive Protein calorie malnutrition R/o dysphagia P:   NPO  SLP when appropriate Family reports patient would not want a feeding tube  HEMATOLOGIC A:   Leukocytosis P:  Trend CBC Heparin for VTE ppx  INFECTIOUS A:   Multilobar PNA +/- Aspiration +/- HCAP  UTI Fever  P:   Follow cultures, add sputum and urine Continue vanc and cefepime Add azithro for legionella coverage Checking urine strep and legionella Trend WBC and fever curve  ENDOCRINE A:   ? Hx of hypothyroidism R/o adrenal insufficiency P:   Checking TSH and cortisol Start solu-cortef  q 6hr  NEUROLOGIC A:   Acute encephalopathy- likely r/t to sepsis Hx of non-tremorous Parkinson's and Dementia  P:   Head CT read pending Frequent Neuro checks Hold pre-admit sinemet, Seroquel, pimavanserin   FAMILY  - Updates: Wife, son, and daughter at the bedside and updated.  Family states patient has advance directives and will bring them in.  They know patient would never want a feeding tube but for now wishes to remain a full code in hopes he will improve- including CPR, defib, and intubation.  However, if he were to need prolonged life-support, family would not want these measures.  Wife, Irish Lack [phone] Sharyn Dross [phone]  - Inter-disciplinary family meet or Palliative Care meeting due by:  08/02/2017  CCT 60 mins  Posey Boyer, ACNP Pulmonary and Critical Care Medicine [facility] Pager: [phone]  07/26/2017, 8:08 PM ..STAFF NOTE  I, Dr Newell Coral have personally reviewed patient's available data, including medical history, events of note, physical examination and test results as part of my evaluation. I have discussed with NP and other care providers such as pharmacist, RN and RRT.  In addition,  I personally evaluated patient. I agree with the note above by ACNP Lodema Hong  83 yr old male with a PMHx of Parkinsons' dementia  (non tremor), BPH, urinary incontinence,  nephrolithiasis, failure to thrive presents from Nursing Home with fever , hypotension and HR >170 . Per family they noticed a change in his baseline on Tuesday 18th Sept. His wife visited him last evening 9/19 and noticed he was having difficulty swallowing his food. Pt has no prior cardiac  history. EKG showed atrial fib at 179 bpm with RBBB and LPFB, ant lateral ST depression. Troponins and BNP elevated. Both CHADS2VASC =2 and HASBLED- (2 points moderate risk for bleeding) calculated.  NSTEMI-cardiology did not want IV heparin or chronic AC.   Patient requiring supplemental O2 and in AFib (Wells score 6 moderate rpob. ) intermediate pre-test probability for PE recommend D-dimer if elevated will start on heparin ggt benefits outweigh risks. Get CTA . CXR shows Patchy airspace disease bilaterally, right greater than left, most consistent with multifocal pneumonia. Will check ABG. History of difficulty swallowing food therefore possible element of aspiration with a superimposed infection. Pt lives in NH so will cover for HCAP. Noted allergies, age and QTc for antibiotic selection. UA + leuk est and bacteria, Blood cx x2 collected Pt received 3.25L IVF and empiric antibiotics will trend fever curve, WBC and lactate. 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  replace needed electrolytes.  No further NS (BNP elevated). Will monitor hemodynamics if increasing needs with neo ggt will place A-line and central venous catheter and get CVP. Check cortisol level and start stress dose steroids. Altered from baseline per family, answers questions, neurochecks, CTH negative for any acute pathology.  Place on ICU Glycemic protocol. DVT PPx on board. Discussed at length with family regarding patient's wishes. As stated above pt is a full code they will bring in living will/advanced  directive asap. Wife expressed that he did not want a feeding tube and did not want to be sustained by machines.    The patient is critically ill with multiple organ systems failure and requires high complexity decision making for assessment and support, frequent evaluation and titration of therapies, application of advanced monitoring technologies and extensive interpretation of multiple databases.   Critical Care Time devoted to patient care services described in this note is 60 Minutes. This time reflects time of care of this signee Dr Newell Coral. This critical care time does not reflect procedure time, or teaching time or supervisory time of NP but could involve care discussion time    Dr. Newell Coral Pulmonary Critical Care Medicine Locums  07/26/2017 9:45 PM

## 2017-07-26 NOTE — ED Provider Notes (Addendum)
[facility]-EMERGENCY DEPT Provider Note   CSN: [id number] Arrival date & time: 07/26/17  1537     History   Chief Complaint Chief Complaint  Patient presents with  . Pneumonia    HPI Martin Lowe is a 83 y.o. male.  HPI Patient is transferred by EMS from [facility] rehabilitation facility. Patient was diagnosed with pneumonia today by chest x-ray. Reportedly patient had fever to 102 this morning. He was given Tylenol. Patient has history of dementia and Parkinson's disease but note sent with patient indicates he had decreased activity level and not eating. Patient does communicate. He endorses chest discomfort and coughing. He cannot say how long he's had a cough. He denied awareness of his recent diagnosis of pneumonia. Per EMS, on Route patient had atrial fibrillation rates between 140 and 210. There is no known history of atrial fibrillation.   16:45 family is now bedside. Patient's wife and daughter reports that he was not himself starting 2 days ago. He seemed more confused and fatigued. He was not eating well. Family does report he has frequent falls. He did fall last night at about 3 AM. It is not atypical for him to get out of bed and the nursing staff to find him down. He was put back to bed after sitting at the nurses station for while under observation. This morning the identified fever and hypoxia. This precipitated the evaluation diagnosis of pneumonia.  The family reports that the patient is full code. They would want intubation and resuscitation. Past Medical History:  Diagnosis Date  . Atrial fibrillation (HCC)   . BPH (benign prostatic hyperplasia)   . Dementia   . Failure to thrive in adult   . Frequent falls   . Hypothyroidism   . Kidney stone   . Parkinson disease (HCC)   . Urinary incontinence     Patient Active Problem List   Diagnosis Date Noted  . Dementia due to Parkinson's disease with behavioral disturbance (HCC) 02/07/2017  . Major depression,  chronic 02/07/2017  . Severe protein-calorie malnutrition (HCC) 02/07/2017  . Chronic constipation 02/07/2017  . Insomnia 02/07/2017  . Benign prostatic hyperplasia with urinary frequency 02/07/2017  . HYPERLIPIDEMIA 11/27/2006  . PARKINSON'S DISEASE 11/27/2006    Past Surgical History:  Procedure Laterality Date  . BLADDER SURGERY    . HERNIA REPAIR    . NEPHRECTOMY    . PROSTATE SURGERY         Home Medications    Prior to Admission medications   Medication Sig Start Date End Date Taking? Authorizing Provider  acetaminophen (TYLENOL) 325 MG tablet Take 650 mg by mouth every 4 (four) hours as needed for mild pain.    [provider]  carbidopa-levodopa (SINEMET IR) 25-100 MG tablet Take 1 tablet by mouth 3 (three) times daily. Take at 8A, 11A, 2P    [provider]  carbidopa-levodopa (SINEMET IR) 25-250 MG tablet Take by mouth. Take @ 8 am, 11 am, 2 pm, 5 pm and 8 pm.   Take 1/2 tab at 10 pm.    [provider]  cholecalciferol (VITAMIN D) 1000 units tablet Take 2,000 Units by mouth daily. 2 tabs to = 2000 units    [provider]  Melatonin 5 MG TABS Take 1 tablet by mouth at bedtime. Give 1/2 hour prior to bedtime    [provider]  Pimavanserin Tartrate (NUPLAZID) 17 MG TABS Take 34 mg by mouth every morning.     [provider]  Polyethylene Glycol 3350 (MIRALAX PO) Take 17 g by mouth every morning.    [provider]  Psyllium (METAMUCIL FIBER PO) Take 1 scoop by mouth daily.    [provider]  QUEtiapine (SEROQUEL) 100 MG tablet Take 100 mg by mouth at bedtime.    [provider]  solifenacin (VESICARE) 10 MG tablet Take 10 mg by mouth at bedtime.     [provider]  tamsulosin (FLOMAX) 0.4 MG CAPS capsule Take 0.4 mg by mouth at bedtime.    [provider]  UNABLE TO FIND Med Name: Lavender oil. Apply one drop of lavender oil behind each ear    [provider]    UNABLE TO FIND Med Name: Mighty shake by mouth daily    [provider]    Family History Family History  Problem Relation Age of Onset  . Alzheimer's disease Mother   . COPD Father     Social History Social History  Substance Use Topics  . Smoking status: Never Smoker  . Smokeless tobacco: Never Used  . Alcohol use No     Allergies   Ciprofloxacin and Nitrofurantoin   Review of Systems Review of Systems  Cannot obtain review systems level V caveat dementia. Physical Exam Updated Vital Signs BP (!) 82/57   Pulse 68   Temp 98.3 F (36.8 C) (Rectal)   Resp 14   Ht  (1.778 m)   Wt 70.8 kg (156 lb)   SpO2 98%   BMI 22.38 kg/m   Physical Exam  Constitutional:  Patient is alert and nontoxic in appearance. He is not exhibiting respiratory distress at rest. He is situationally attentive and assists and following commands.  HENT:  Head: Normocephalic and atraumatic.  Mucous membranes are dry with brown film on tongue and palate.  Eyes: EOM are normal.  Neck: Neck supple.  Cardiovascular:  Tachycardia irregularly irregular.  Pulmonary/Chest:  Mild tachypnea but no respiratory distress at rest. Good air flow to the bases. Slight Rales in the bases.  Abdominal: Soft. He exhibits no distension. There is no tenderness. There is no guarding.  Genitourinary: Penis normal.  Musculoskeletal: Normal range of motion. He exhibits no edema, tenderness or deformity.  Neurological: He is alert. No cranial nerve deficit. Coordination normal.  Patient is awake and alert. He is however situationally confused. Family member's report baseline is for significant dementia and poor communication. He however at baseline is alert and interacts. He follows some commands. No focal neurologic deficit.  Skin: Skin is warm and dry.  Psychiatric: He has a normal mood and affect.     ED Treatments / Results  Labs (all labs ordered are listed, but only abnormal results are  displayed) Labs Reviewed  COMPREHENSIVE METABOLIC PANEL - Abnormal; Notable for the following:       Result Value   Glucose, Bld 106 (*)    BUN 38 (*)    Creatinine, Ser 1.70 (*)    Calcium 8.7 (*)    Total Protein 6.4 (*)    Albumin 3.2 (*)    AST 84 (*)    ALT 10 (*)    Total Bilirubin 1.9 (*)    GFR calc non Af Amer 36 (*)    GFR calc Af Amer 42 (*)    All other components within normal limits  CBC WITH DIFFERENTIAL/PLATELET - Abnormal; Notable for the following:    WBC 11.7 (*)    RBC 4.16 (*)  Hemoglobin 12.4 (*)    HCT 38.5 (*)    Neutro Abs 9.7 (*)    All other components within normal limits  PROTIME-INR - Abnormal; Notable for the following:    Prothrombin Time 16.5 (*)    All other components within normal limits  URINALYSIS, ROUTINE W REFLEX MICROSCOPIC - Abnormal; Notable for the following:    Color, Urine AMBER (*)    APPearance HAZY (*)    Ketones, ur 5 (*)    Protein, ur 30 (*)    Leukocytes, UA MODERATE (*)    Bacteria, UA MANY (*)    Squamous Epithelial / LPF 0-5 (*)    All other components within normal limits  BRAIN NATRIURETIC PEPTIDE - Abnormal; Notable for the following:    B Natriuretic Peptide 939.5 (*)    All other components within normal limits  I-STAT CG4 LACTIC ACID, ED - Abnormal; Notable for the following:    Lactic Acid, Venous 2.55 (*)    All other components within normal limits  I-STAT TROPONIN, ED - Abnormal; Notable for the following:    Troponin i, poc 3.12 (*)    All other components within normal limits  CULTURE, BLOOD (ROUTINE X 2)  CULTURE, BLOOD (ROUTINE X 2)  I-STAT CG4 LACTIC ACID, ED  I-STAT CG4 LACTIC ACID, ED  I-STAT CG4 LACTIC ACID, ED    EKG  EKG Interpretation  Date/Time:  Thursday July 26 2017 16:11:13 EDT Ventricular Rate:  179 PR Interval:    QRS Duration: 137 QT Interval:  271 QTC Calculation: 468 R Axis:   108 Text Interpretation:  Wide-QRS tachycardia RBBB and LPFB afib. ant lateral ST  depression. no STEMI Confirmed by Arby Barrette [phone]) on 07/26/2017 5:41:28 PM       Radiology Dg Chest Port 1 View  Result Date: 07/26/2017 CLINICAL DATA:  Sepsis EXAM: PORTABLE CHEST 1 VIEW COMPARISON:  CT chest of 11/22/2015 and chest x-ray of the same day FINDINGS: There is patchy airspace disease bilaterally right greater than left. No definite effusion is seen. These changes most likely reflect multifocal pneumonia with congestive heart failure less likely. The heart is within upper limits normal. No acute bony abnormality is seen. IMPRESSION: Patchy airspace disease bilaterally, right greater than left, most consistent with multifocal pneumonia. Electronically Signed   By: Dwyane Dee M.D.   On: 07/26/2017 17:08    Procedures Procedures (including critical care time)  Medications Ordered in ED Medications  amiodarone (NEXTERONE) 1.8 mg/mL load via infusion 150 mg (0 mg Intravenous Hold 07/26/17 1810)    Followed by  amiodarone (NEXTERONE PREMIX) 360-4.14 MG/200ML-% (1.8 mg/mL) IV infusion (60 mg/hr Intravenous Not Given 07/26/17 1811)    Followed by  amiodarone (NEXTERONE PREMIX) 360-4.14 MG/200ML-% (1.8 mg/mL) IV infusion (not administered)  sodium chloride 0.9 % bolus 1,000 mL (1,000 mLs Intravenous New Bag/Given 07/26/17 1853)  vancomycin (VANCOCIN) IVPB 1000 mg/200 mL premix (not administered)  ceFEPIme (MAXIPIME) 1 g in dextrose 5 % 50 mL IVPB (not administered)  phenylephrine (NEO-SYNEPHRINE) 10 mg in dextrose 5 % 250 mL (0.04 mg/mL) infusion (not administered)  sodium chloride 0.9 % bolus 1,000 mL (0 mLs Intravenous Stopped 07/26/17 1644)    And  sodium chloride 0.9 % bolus 1,000 mL (0 mLs Intravenous Stopped 07/26/17 1643)    And  sodium chloride 0.9 % bolus 250 mL (0 mLs Intravenous Stopped 07/26/17 1642)  ceFEPIme (MAXIPIME) 2 g in dextrose 5 % 50 mL IVPB (0 g Intravenous Stopped 07/26/17 1642)  vancomycin (VANCOCIN) IVPB 1000 mg/200 mL premix (0 mg Intravenous Stopped  07/26/17 1716)  vancomycin (VANCOCIN) 500 mg in sodium chloride 0.9 % 100 mL IVPB (0 mg Intravenous Stopped 07/26/17 1812)  aspirin chewable tablet 324 mg (324 mg Oral Given 07/26/17 1633)  0.9 %  sodium chloride infusion ( Intravenous New Bag/Given 07/26/17 1716)     Initial Impression / Assessment and Plan / ED Course  I have reviewed the triage vital signs and the nursing notes.  Pertinent labs & imaging results that were available during my care of the patient were reviewed by me and considered in my medical decision making (see chart for details).     Consult: (17:25) APP covering for cardiology advised calls are currently going to attending physician Dr. Elease Hashimoto and will need to contact him regarding consult . Reviewed with Dr.Nahser. Recommends amiodarone bolus and drip over Cardizem for rate control. Okay to temporize with heparin for atrial fibrillation during management of pneumonia and inpatient treatment. Likely poor candidate for ongoing anticoagulations with dementia and frequent falls. Patient will be seen in consultation. Consult: (19:20)Review Dr. Sherene Sires intensivist.Will plan for admission. Final Clinical Impressions(s) / ED Diagnoses   Final diagnoses:  Sepsis, due to unspecified organism (HCC)  HCAP (healthcare-associated pneumonia)  Atrial fibrillation with RVR (HCC)  Troponin level elevated  Parkinson's disease (HCC)   Patient presents as per above. Primary etiology of symptoms appears to be pneumonia. Patient presented with atrial fibrillation rapid ventricular response and elevated troponin. Sepsis protocol initiated with fluid resuscitation and antibiotics. After patient returned from CT head, he spontaneously converted to sinus rhythm prior to administration of either the ordered Cardizem or amiodarone which were both held and discontinued. Once converted however patient's blood pressure began trending down to high 70s and mid 80s systolic. Patient has completed original  sepsis based fluid resuscitation with now persistent hypotension despite Spontaneous resolution of A. Fib RVR and now Heart rate in the 70s. Will initiate pressor therapy and consult intensivist. Patient condition is guarded. Dr. Elease Hashimoto has reviewed cardiac management with family and patient is not candidate for interventional cardiac procedures. Also do not plan to administer heparin. Per Dr. Lourena Simmonds this will not be helpful forward is likely demand ischemia. Plan will be for medical stabilization in critically ill patient with comorbid illness of Parkinson's disease with dementia. New Prescriptions New Prescriptions   No medications on file     Arby Barrette, MD 07/26/17 1918    Arby Barrette, MD 07/26/17 Ernestina Columbia

## 2017-07-27 ENCOUNTER — Inpatient Hospital Stay (HOSPITAL_COMMUNITY): Payer: Medicare Other

## 2017-07-27 DIAGNOSIS — I361 Nonrheumatic tricuspid (valve) insufficiency: Secondary | ICD-10-CM

## 2017-07-27 DIAGNOSIS — J189 Pneumonia, unspecified organism: Secondary | ICD-10-CM

## 2017-07-27 DIAGNOSIS — R748 Abnormal levels of other serum enzymes: Secondary | ICD-10-CM

## 2017-07-27 LAB — ECHOCARDIOGRAM COMPLETE
CHL CUP MV DEC (S): 246
E decel time: 246 msec
FS: 18 % — AB (ref 28–44)
HEIGHTINCHES: 70 in
IVS/LV PW RATIO, ED: 0.91
LA vol index: 29.2 mL/m2
LADIAMINDEX: 2.27 cm/m2
LASIZE: 42 mm
LAVOL: 54 mL
LAVOLA4C: 40 mL
LEFT ATRIUM END SYS DIAM: 42 mm
LV PW d: 11 mm — AB (ref 0.6–1.1)
LV TDI E'LATERAL: 8.7
LV TDI E'MEDIAL: 5.44
LV e' LATERAL: 8.7 cm/s
LVOT SV: 31 mL
LVOT VTI: 12.3 cm
LVOT area: 2.54 cm2
LVOT peak grad rest: 1 mmHg
LVOT peak vel: 60.6 cm/s
LVOTD: 18 mm
MV pk E vel: 1.9 m/s
RV LATERAL S' VELOCITY: 9.68 cm/s
TAPSE: 20 mm
VTI: 159 cm
WEIGHTICAEL: 2402.13 [oz_av]

## 2017-07-27 LAB — PHOSPHORUS: Phosphorus: 2.8 mg/dL (ref 2.5–4.6)

## 2017-07-27 LAB — RENAL FUNCTION PANEL
Albumin: 2.7 g/dL — ABNORMAL LOW (ref 3.5–5.0)
Anion gap: 8 (ref 5–15)
BUN: 40 mg/dL — AB (ref 6–20)
CHLORIDE: 109 mmol/L (ref 101–111)
CO2: 19 mmol/L — AB (ref 22–32)
CREATININE: 1.37 mg/dL — AB (ref 0.61–1.24)
Calcium: 8.1 mg/dL — ABNORMAL LOW (ref 8.9–10.3)
GFR calc non Af Amer: 47 mL/min — ABNORMAL LOW (ref >60)
GFR, EST AFRICAN AMERICAN: 54 mL/min — AB (ref >60)
Glucose, Bld: 140 mg/dL — ABNORMAL HIGH (ref 65–99)
Phosphorus: 3 mg/dL (ref 2.5–4.6)
Potassium: 4.4 mmol/L (ref 3.5–5.1)
Sodium: 136 mmol/L (ref 135–145)

## 2017-07-27 LAB — BASIC METABOLIC PANEL
Anion gap: 7 (ref 5–15)
BUN: 38 mg/dL — ABNORMAL HIGH (ref 6–20)
CALCIUM: 8.1 mg/dL — AB (ref 8.9–10.3)
CO2: 20 mmol/L — AB (ref 22–32)
Chloride: 109 mmol/L (ref 101–111)
Creatinine, Ser: 1.31 mg/dL — ABNORMAL HIGH (ref 0.61–1.24)
GFR calc Af Amer: 57 mL/min — ABNORMAL LOW (ref >60)
GFR calc non Af Amer: 49 mL/min — ABNORMAL LOW (ref >60)
GLUCOSE: 122 mg/dL — AB (ref 65–99)
Potassium: 4.3 mmol/L (ref 3.5–5.1)
Sodium: 136 mmol/L (ref 135–145)

## 2017-07-27 LAB — LACTIC ACID, PLASMA
LACTIC ACID, VENOUS: 2.3 mmol/L — AB (ref 0.5–1.9)
Lactic Acid, Venous: 1.7 mmol/L (ref 0.5–1.9)

## 2017-07-27 LAB — CORTISOL: CORTISOL PLASMA: 33.3 ug/dL

## 2017-07-27 LAB — TROPONIN I
TROPONIN I: 5.37 ng/mL — AB (ref <0.03)
Troponin I: 6.02 ng/mL (ref <0.03)

## 2017-07-27 LAB — TSH: TSH: 2.067 u[IU]/mL (ref 0.350–4.500)

## 2017-07-27 LAB — PROCALCITONIN: Procalcitonin: 2.83 ng/mL

## 2017-07-27 LAB — MRSA PCR SCREENING: MRSA by PCR: NEGATIVE

## 2017-07-27 LAB — MAGNESIUM
MAGNESIUM: 1.9 mg/dL (ref 1.7–2.4)
Magnesium: 2 mg/dL (ref 1.7–2.4)

## 2017-07-27 MED ORDER — IOPAMIDOL (ISOVUE-370) INJECTION 76%
INTRAVENOUS | Status: AC
  Administered 2017-07-27: 100 mL
  Filled 2017-07-27: qty 100

## 2017-07-27 MED ORDER — HEPARIN (PORCINE) IN NACL 100-0.45 UNIT/ML-% IJ SOLN
900.0000 [IU]/h | INTRAMUSCULAR | Status: DC
  Administered 2017-07-27: 900 [IU]/h via INTRAVENOUS
  Filled 2017-07-27: qty 250

## 2017-07-27 MED ORDER — HEPARIN SODIUM (PORCINE) 5000 UNIT/ML IJ SOLN
5000.0000 [IU] | Freq: Three times a day (TID) | INTRAMUSCULAR | Status: DC
  Administered 2017-07-27 – 2017-08-02 (×17): 5000 [IU] via SUBCUTANEOUS
  Filled 2017-07-27 (×18): qty 1

## 2017-07-27 MED ORDER — HEPARIN BOLUS VIA INFUSION
1000.0000 [IU] | Freq: Once | INTRAVENOUS | Status: AC
  Administered 2017-07-27: 1000 [IU] via INTRAVENOUS
  Filled 2017-07-27: qty 1000

## 2017-07-27 MED ORDER — CHLORHEXIDINE GLUCONATE 0.12 % MT SOLN
15.0000 mL | Freq: Two times a day (BID) | OROMUCOSAL | Status: DC
Start: 2017-07-27 — End: 2017-08-04
  Administered 2017-07-27 – 2017-08-04 (×12): 15 mL via OROMUCOSAL
  Filled 2017-07-27 (×11): qty 15

## 2017-07-27 MED ORDER — LACTULOSE ENEMA
300.0000 mL | Freq: Once | ORAL | Status: DC
  Filled 2017-07-27: qty 300

## 2017-07-27 MED ORDER — NOREPINEPHRINE BITARTRATE 1 MG/ML IV SOLN
0.0000 ug/min | INTRAVENOUS | Status: DC
  Filled 2017-07-27: qty 4

## 2017-07-27 MED ORDER — ORAL CARE MOUTH RINSE
15.0000 mL | Freq: Two times a day (BID) | OROMUCOSAL | Status: DC
  Administered 2017-07-27 – 2017-08-02 (×10): 15 mL via OROMUCOSAL

## 2017-07-27 NOTE — Progress Notes (Addendum)
PULMONARY / CRITICAL CARE MEDICINE   Name: Martin Lowe MRN: [id number] DOB: 1934-10-30    ADMISSION DATE:  07/26/2017 CONSULTATION DATE:  07/26/2017  REFERRING MD:  Dr. Donnald Lowe  CHIEF COMPLAINT:  Fever, cough  HISTORY OF PRESENT ILLNESS:  HPI obtained from medical chart review and patient's wife, daughter, and son at the bedside due to acute encephalopathy.   82 year old with past medical history of Parkinson's disease, BPH, dementia, hypothyroidism, failure to thrive, urinary incontinence, HOH, and frequent falls who presented to [facility] ER with complaints of fever and cough.   Patient is a resident at [facility] since March 2018.  He goes by "Martin Lowe".  He is normally able to communicate but confused and uses a wheelchair for mobilization but often attempts to walk causing him to have multiple falls.  His family reports increased dysphagia and oral intake since Tuesday and today with increased lethargy.  Noted to have a 102.7 fever and CXR performed at SNF reported a right pneumonia.  He was sent to the ER for further treatment.  In the ER, patient was found to be in rapid Afib w/ RVR and initially normotensive, oxygen sats 83% on RA, and febrile.  CXR noted for multifocal pneumonia right worse than left.  Head CT negative for acute process.  Labs significant for WBC 11.7, LA 2.55 -> 1.48, sCr 1.7, +UTI, BNP 939, and troponin 3.12 with EKG showing diffuse ST depression and Rapid Afib.  He was treated with 3L NS bolus, vancomycin and cefepime.  Cardiology consulted.  He spontaneously converted to NSR in the 70's but progressively became more hypotensive requiring low-dose vasopressor.   Therefore, PCCM to admit.     SUBJECTIVE:  Off pressors On heparin Awake and alert NSR Gas Foci in ct face  VITAL SIGNS: BP 104/71 (BP Location: Right Arm)   Pulse 65   Temp 98.3 F (36.8 C) (Oral)   Resp 19   Ht  (1.778 m)   Wt 150 lb 2.1 oz (68.1 kg)   SpO2 95%   BMI 21.54 kg/m    HEMODYNAMICS:    VENTILATOR SETTINGS:    INTAKE / OUTPUT: I/O last 3 completed shifts: In: 4190.2 [I.V.:340.2; IV Piggyback:3850] Out: 300 [Urine:300]  PHYSICAL EXAMINATION:  General:  Thin frail male NAD HEENT: NO jvd/lan QMV:HQIO affect Neuro: follows commands CV: NSR RRR PULM: even/non-labored, lungs bilaterallyclear NG:EXBM, non-tender, bsx4 active , NPO Extremities: warm/dry,-edema  Skin: no rashes or lesions   LABS:  BMET  Recent Labs Lab 07/26/17 1546 07/27/17 0203 07/27/17 0403  NA 135 136 136  K 4.6 4.4 4.3  CL 102 109 109  CO2 22 19* 20*  BUN 38* 40* 38*  CREATININE 1.70* 1.37* 1.31*  GLUCOSE 106* 140* 122*    Electrolytes  Recent Labs Lab 07/26/17 1546 07/26/17 2247 07/27/17 0203 07/27/17 0403  CALCIUM 8.7*  --  8.1* 8.1*  MG  --  1.9  --  2.0  PHOS  --   --  3.0 2.8    CBC  Recent Labs Lab 07/26/17 1546  WBC 11.7*  HGB 12.4*  HCT 38.5*  PLT 292    Coag's  Recent Labs Lab 07/26/17 1546  INR 1.35    Sepsis Markers  Recent Labs Lab 07/26/17 1922 07/26/17 2317 07/27/17 0208 07/27/17 0403  LATICACIDVEN 1.47 2.3* 1.7  --   PROCALCITON  --   --   --  2.83    ABG  Recent Labs Lab 07/26/17 2114  PHART 7.420  PCO2ART 27.5*  PO2ART 85.0    Liver Enzymes  Recent Labs Lab 07/26/17 1546 07/27/17 0203  AST 84*  --   ALT 10*  --   ALKPHOS 87  --   BILITOT 1.9*  --   ALBUMIN 3.2* 2.7*    Cardiac Enzymes  Recent Labs Lab 07/26/17 2247 07/27/17 0403  TROPONINI 5.37* 6.02*    Glucose No results for input(s): GLUCAP in the last 168 hours.  Imaging Ct Head Wo Contrast  Result Date: 07/26/2017 CLINICAL DATA:  Minor head trauma. Fever. Parkinson's disease and dementia. EXAM: CT HEAD WITHOUT CONTRAST TECHNIQUE: Contiguous axial images were obtained from the base of the skull through the vertex without intravenous contrast. COMPARISON:  Head CT 11/22/2015 FINDINGS: Brain: No mass lesion, intraparenchymal  hemorrhage or extra-axial collection. No evidence of acute cortical infarct. There is periventricular hypoattenuation compatible with chronic microvascular disease. There is generalized atrophy, unchanged from the prior study. Vascular: No hyperdense vessel or unexpected calcification. Skull: No calvarial fracture. There is multifocal soft tissue emphysema within the bilateral masticator spaces, left parotid space and in the left posterior paravertebral soft tissues. There are also foci of gas within the epidural venous plexus. Sinuses/Orbits: There is complete opacification of the right frontal and ethmoid sinuses. Normal orbits. IMPRESSION: 1. No acute intracranial abnormality. 2. Generalized atrophy with chronic ischemic microangiopathy. 3. Multiple foci of soft tissue gas within the bilateral subcutaneous fascial tissues, bilateral masticator spaces, left parotid space and in the epidural venous plexus. This is of uncertain etiology. Is there a history of recent trauma? Given the presence of air within the epidural venous plexus it is possible that the other foci of gas are also within very small veins, in which case it may be secondary to IV infusion. Electronically Signed   By: Deatra Robinson M.D.   On: 07/26/2017 22:44   Ct Angio Chest Pe W Or Wo Contrast  Result Date: 07/27/2017 CLINICAL DATA:  Confusion. Shortness of breath. Elevated D-dimer. Positive for pneumonia. EXAM: CT ANGIOGRAPHY CHEST WITH CONTRAST TECHNIQUE: Multidetector CT imaging of the chest was performed using the standard protocol during bolus administration of intravenous contrast. Multiplanar CT image reconstructions and MIPs were obtained to evaluate the vascular anatomy. CONTRAST:  78 mL Isovue 370 COMPARISON:  Chest radiograph 08/03/2017.  CT chest 11/22/2015 FINDINGS: Cardiovascular: Good opacification of the central and segmental pulmonary arteries. No focal filling defects. No evidence of significant pulmonary embolus. Cardiac  enlargement. Reflux of contrast material into the hepatic veins suggesting passive congestion due to right heart failure. Coronary artery and aortic calcifications. No pericardial effusions. Normal caliber thoracic aorta. Mediastinum/Nodes: Bilateral hilar prominence may indicate hilar lymphadenopathy. Esophagus is decompressed. Lungs/Pleura: Diffuse patchy airspace disease scattered throughout both lungs with predominant perihilar distribution. There is severe peribronchial thickening bilaterally. Multiple air bronchograms. Changes are most likely to represent multifocal bronchial pneumonia. Other considerations could include sarcoidosis, cryptogenic organizing pneumonia, or possibly atypical pneumonia such as TB. No pleural effusions. No pneumothorax. Focal narrowing of the bronchus intermedius, likely inflammatory. Focal areas of narrowing of more distal bronchi bilaterally, also likely inflammatory. Upper Abdomen: Gallbladder appears to be distended. No definite acute process demonstrated in the visualized upper abdomen. Musculoskeletal: Degenerative changes in the spine. No destructive bone lesions. Review of the MIP images confirms the above findings. IMPRESSION: 1. No evidence of significant pulmonary embolus. 2. Diffuse patchy airspace disease throughout the lungs with prominent peribronchial thickening causing areas of bronchial narrowing. This likely represents multifocal bronchopneumonia.  3. Bilateral hilar lymphadenopathy, likely reactive. 4. Cardiac enlargement with evidence of right heart failure. Electronically Signed   By: Burman Nieves M.D.   On: 07/27/2017 03:55   Dg Chest Port 1 View  Result Date: 07/26/2017 CLINICAL DATA:  Sepsis EXAM: PORTABLE CHEST 1 VIEW COMPARISON:  CT chest of 11/22/2015 and chest x-ray of the same day FINDINGS: There is patchy airspace disease bilaterally right greater than left. No definite effusion is seen. These changes most likely reflect multifocal pneumonia  with congestive heart failure less likely. The heart is within upper limits normal. No acute bony abnormality is seen. IMPRESSION: Patchy airspace disease bilaterally, right greater than left, most consistent with multifocal pneumonia. Electronically Signed   By: Dwyane Dee M.D.   On: 07/26/2017 17:08   STUDIES:  CT head 9/20 >>areas of gas in facial tissue??? TTE 9/21 >>> 9/20 ct a >>neg PE  CULTURES: 9/20 BCx 2 >> 9/20 UC >> sputum  ANTIBIOTICS: Vancomycin 9/20 >> Cefepime 9/20 >> Azithromycin 9/20 >>  SIGNIFICANT EVENTS: 9/20 Admit  LINES/TUBES: PIV x 2  9/21 left radial aline >>  DISCUSSION: 71 yoM from SNF w/PMH of parkinson's and dementia who presented with fever, cough x 1 day, and difficulty swallowing, poor po intake, and increasing lethargy since Tuesday found to have multifocal PNA, UTI and rapid Afib- now SR .  Required low pressors after 3L NS. Now off pressors. HD stable.   ASSESSMENT / PLAN:  PULMONARY A: Multifocal PNA +/- Aspiration +/- HCAP Dysphagia R/o PE (Wells score 6) - concerning given O2 needs, RBBB, Afib w/RVR P:   On room air 9/21 Continue supplemental O2 for sats >92% See ID Will need SLP when appropriate , most likely aspiration pan per cxr CTA neg PE therefore dc heparin if OK with cards Consider tx to SDU or tele   CARDIOVASCULAR A:  Afib w/RVR - new, converted to SR after IVF Shock- likely septic +/- r/o component of cardiogenic, 9/21 off pressors Positive troponin with abnormal EKG/ RBBB- possible r/t demand ischemia rapid rate P:  Tele monitoring Cardiology following with recs for no heparin for CHADS2VASC score of 2 (age) TTE ordered Recheck EKG post conversion to SR Trend Troponin's x 3 last 6.01 Not a candidate for chronic anticoagulation given fall risk/hx Neo for MAP > 65 Checking cortisol level and start stress dose steroids, cortisol 33 therefore no indication for steroids will dc 9/21   RENAL Lab Results   Component Value Date   CREATININE 1.31 (H) 07/27/2017   CREATININE 1.37 (H) 07/27/2017   CREATININE 1.70 (H) 07/26/2017    Recent Labs Lab 07/26/17 1546 07/27/17 0203 07/27/17 0403  K 4.6 4.4 4.3     A:   AKI- baseline sCr 1.3 (5/18) Lactic acidosis- resolved 9/21 UTI Hx BPH, urinary incontinence  P:   Hold further IVFs Trend BMP / mag/ phos urinary output/ daily weights Replace electrolytes as indicated Place condom cath, watch I/Os for urinary retention See ID  Hold pre-admit vesicare and flomax   GASTROINTESTINAL A:   Failure to thrive Protein calorie malnutrition R/o dysphagia P:   NPO  SLP when appropriate Family reports patient would not want a feeding tube  HEMATOLOGIC A:   Leukocytosis P:  Trend CBC Heparin for VTE ppx  INFECTIOUS A:   Multilobar PNA +/- Aspiration +/- HCAP  UTI Fever  P:   Follow cultures, add sputum and urine Continue vanc and cefepime Add azithro for legionella coverage Checking urine strep neg  and legionella Trend WBC and fever curve  ENDOCRINE A:   ? Hx of hypothyroidism R/o adrenal insufficiency P:    TSH 2.067 and cortisol 33.3 Start solu-cortef  q 6hr  NEUROLOGIC A:   Acute encephalopathy- likely r/t to sepsis Hx of non-tremorous Parkinson's and Dementia  P:   Head CT read No acute abnormality but with facial tissue gas foci ?etiology  Frequent Neuro checks Hold pre-admit sinemet, Seroquel, pimavanserin   FAMILY  - Updates: Wife, son, and daughter at the bedside and updated.  Family states patient has advance directives and will bring them in.  They know patient would never want a feeding tube but for now wishes to remain a full code in hopes he will improve- including CPR, defib, and intubation.  However, if he were to need prolonged life-support, family would not want these measures.  Wife, Irish Lack [phone] Sharyn Dross [phone]  - Inter-disciplinary family meet or Palliative Care meeting  due by:  08/02/2017   Brett Canales Minor ACNP Adolph Pollack PCCM Pager [phone] till 3 pm If no answer page [phone] 07/27/2017, 8:57 AM   STAFF NOTE: I, Rory Percy, MD FACP have personally reviewed patient's available data, including medical history, events of note, physical examination and test results as part of my evaluation. I have discussed with resident/NP and other care providers such as pharmacist, RN and RRT. In addition, I personally evaluated patient and elicited key findings of: awake, alert, no distress, jvd low, reduced BS, abdo soft, strong cough, no edema, pcxr which I reviewed shows multi focal PNA rt greater left, CT I reviewed shows same, head Ct without acute and possible trauma face from fall, BP is better after 4 liter spos balance, lactic acid clearing as well, presents with ASP PNA leading to fib now in SR and stress ischemia, maintain asp coverage and nosocomial ( vanc / cef), given exposure SNF and presentaiton of asp risk, would dc Azithro, he is on min O2 and ABG is wnl, follow pcxr in am , heparin not needed for PE ( CT neg), ATN likely and hypovolemia, allow pos balance, crt is resolving, SLP objective important, may need PEG?, will again discuss code status, ETT would  = trach  Mcarthur Rossetti. Tyson Alias, MD, FACP Pgr: [phone]  Pulmonary & Critical Care 07/27/2017 10:23 AM   I have had extensive discussions with family wofe. We discussed patients current circumstances and organ failures. We also discussed patient's prior wishes under circumstances such as this. Family has decided to NOT perform resuscitation if arrest but to continue current medical support for now. NO forms of life support

## 2017-07-27 NOTE — Progress Notes (Signed)
  Echocardiogram 2D Echocardiogram has been performed.  [name] 07/27/2017, 9:17 AM

## 2017-07-27 NOTE — Procedures (Signed)
Arterial Catheter Insertion Procedure Note Martin Lowe [id number] 1934-11-03  Procedure: Insertion of Arterial Catheter  Indications: Blood pressure monitoring  Procedure Details Consent: Risks of procedure as well as the alternatives and risks of each were explained to the (patient/caregiver).  Consent for procedure obtained. and Unable to obtain consent because of altered level of consciousness. Time Out: Verified patient identification, verified procedure, site/side was marked, verified correct patient position, special equipment/implants available, medications/allergies/relevent history reviewed, required imaging and test results available.  Performed  Maximum sterile technique was used including antiseptics, cap, gloves, gown, hand hygiene, mask and sheet. Skin prep: Chlorhexidine; local anesthetic administered 22 gauge catheter was inserted into left radial artery using the Seldinger technique.  Evaluation Blood flow good; BP tracing good. Complications: No apparent complications.   Martin Lowe [facility] 07/27/2017

## 2017-07-27 NOTE — Progress Notes (Signed)
eLink Physician-Brief Progress Note Patient Name: Kejon Feild DOB: 05-28-1934 MRN: [id number]   Date of Service  07/27/2017  HPI/Events of Note  D-Dimer = 2.12.   eICU Interventions  Will order: 1. Heparin IV infusion per pharmacy consult. 2. Chest CTA R/O PE protocol.      Intervention Category Intermediate Interventions: Diagnostic test evaluation  Sommer,Steven Eugene 07/27/2017, 12:15 AM

## 2017-07-27 NOTE — Progress Notes (Signed)
Progress Note  Patient Name: Martin Lowe Date of Encounter: 07/27/2017  Primary Cardiologist: new, Nahser   Subjective   83 year old with past medical history of Parkinson's disease, BPH, dementia, hypothyroidism, failure to thrive, urinary incontinence, HOH, and frequent falls who presented to [facility] ER with complaints of fever and cough  troponins were found to be elevated.  Echo shows mild LV systolic dysfunction    Inpatient Medications    Scheduled Meds: . chlorhexidine  15 mL Mouth Rinse BID  . heparin subcutaneous  5,000 Units Subcutaneous Q8H  . lactulose  300 mL Rectal Once  . mouth rinse  15 mL Mouth Rinse q12n4p   Continuous Infusions: . sodium chloride    . ceFEPime (MAXIPIME) IV    . vancomycin     PRN Meds: sodium chloride   Vital Signs    Vitals:   07/27/17 0800 07/27/17 0900 07/27/17 1000 07/27/17 1100  BP: 110/73 107/72 119/70 108/69  Pulse: 67 72 70 64  Resp: (!) 24 (!) 27 (!) 26 20  Temp:      TempSrc:      SpO2: 96% 95% 92% 95%  Weight:      Height:        Intake/Output Summary (Last 24 hours) at 07/27/17 1140 Last data filed at 07/27/17 1100  Gross per 24 hour  Intake           4293.6 ml  Output              300 ml  Net           3993.6 ml   Filed Weights   07/26/17 1545 07/26/17 2227 07/27/17 0405  Weight: 156 lb (70.8 kg) 148 lb 13 oz (67.5 kg) 150 lb 2.1 oz (68.1 kg)    Telemetry    NSR  - Personally Reviewed  ECG     NSR  - Personally Reviewed  Physical Exam   GEN: elderly man,  Appears chronically ill  Neck: No JVD Cardiac: RRR, no murmurs, rubs, or gallops.  Respiratory: rales, esp. On R side  GI: Soft, nontender, non-distended  MS: No edema;   Extensive muscle wasting  Neuro:  unresponsive   Psych: unable to assess.  Labs    Chemistry Recent Labs Lab 07/26/17 1546 07/27/17 0203 07/27/17 0403  NA 135 136 136  K 4.6 4.4 4.3  CL 102 109 109  CO2 22 19* 20*  GLUCOSE 106* 140* 122*  BUN 38* 40* 38*    CREATININE 1.70* 1.37* 1.31*  CALCIUM 8.7* 8.1* 8.1*  PROT 6.4*  --   --   ALBUMIN 3.2* 2.7*  --   AST 84*  --   --   ALT 10*  --   --   ALKPHOS 87  --   --   BILITOT 1.9*  --   --   GFRNONAA 36* 47* 49*  GFRAA 42* 54* 57*  ANIONGAP Hematology Recent Labs Lab 07/26/17 1546  WBC 11.7*  RBC 4.16*  HGB 12.4*  HCT 38.5*  MCV 92.5  MCH 29.8  MCHC 32.2  RDW 13.8  PLT 292    Cardiac Enzymes Recent Labs Lab 07/26/17 2247 07/27/17 0403  TROPONINI 5.37* 6.02*    Recent Labs Lab 07/26/17 1558  TROPIPOC 3.12*     BNP Recent Labs Lab 07/26/17 1546  BNP 939.5*     DDimer  Recent Labs Lab 07/26/17 2247  DDIMER 2.12*  Radiology    Ct Head Wo Contrast  Result Date: 07/26/2017 CLINICAL DATA:  Minor head trauma. Fever. Parkinson's disease and dementia. EXAM: CT HEAD WITHOUT CONTRAST TECHNIQUE: Contiguous axial images were obtained from the base of the skull through the vertex without intravenous contrast. COMPARISON:  Head CT 11/22/2015 FINDINGS: Brain: No mass lesion, intraparenchymal hemorrhage or extra-axial collection. No evidence of acute cortical infarct. There is periventricular hypoattenuation compatible with chronic microvascular disease. There is generalized atrophy, unchanged from the prior study. Vascular: No hyperdense vessel or unexpected calcification. Skull: No calvarial fracture. There is multifocal soft tissue emphysema within the bilateral masticator spaces, left parotid space and in the left posterior paravertebral soft tissues. There are also foci of gas within the epidural venous plexus. Sinuses/Orbits: There is complete opacification of the right frontal and ethmoid sinuses. Normal orbits. IMPRESSION: 1. No acute intracranial abnormality. 2. Generalized atrophy with chronic ischemic microangiopathy. 3. Multiple foci of soft tissue gas within the bilateral subcutaneous fascial tissues, bilateral masticator spaces, left parotid space and in  the epidural venous plexus. This is of uncertain etiology. Is there a history of recent trauma? Given the presence of air within the epidural venous plexus it is possible that the other foci of gas are also within very small veins, in which case it may be secondary to IV infusion. Electronically Signed   By: Deatra Robinson M.D.   On: 07/26/2017 22:44   Ct Angio Chest Pe W Or Wo Contrast  Result Date: 07/27/2017 CLINICAL DATA:  Confusion. Shortness of breath. Elevated D-dimer. Positive for pneumonia. EXAM: CT ANGIOGRAPHY CHEST WITH CONTRAST TECHNIQUE: Multidetector CT imaging of the chest was performed using the standard protocol during bolus administration of intravenous contrast. Multiplanar CT image reconstructions and MIPs were obtained to evaluate the vascular anatomy. CONTRAST:  78 mL Isovue 370 COMPARISON:  Chest radiograph 08/03/2017.  CT chest 11/22/2015 FINDINGS: Cardiovascular: Good opacification of the central and segmental pulmonary arteries. No focal filling defects. No evidence of significant pulmonary embolus. Cardiac enlargement. Reflux of contrast material into the hepatic veins suggesting passive congestion due to right heart failure. Coronary artery and aortic calcifications. No pericardial effusions. Normal caliber thoracic aorta. Mediastinum/Nodes: Bilateral hilar prominence may indicate hilar lymphadenopathy. Esophagus is decompressed. Lungs/Pleura: Diffuse patchy airspace disease scattered throughout both lungs with predominant perihilar distribution. There is severe peribronchial thickening bilaterally. Multiple air bronchograms. Changes are most likely to represent multifocal bronchial pneumonia. Other considerations could include sarcoidosis, cryptogenic organizing pneumonia, or possibly atypical pneumonia such as TB. No pleural effusions. No pneumothorax. Focal narrowing of the bronchus intermedius, likely inflammatory. Focal areas of narrowing of more distal bronchi bilaterally, also  likely inflammatory. Upper Abdomen: Gallbladder appears to be distended. No definite acute process demonstrated in the visualized upper abdomen. Musculoskeletal: Degenerative changes in the spine. No destructive bone lesions. Review of the MIP images confirms the above findings. IMPRESSION: 1. No evidence of significant pulmonary embolus. 2. Diffuse patchy airspace disease throughout the lungs with prominent peribronchial thickening causing areas of bronchial narrowing. This likely represents multifocal bronchopneumonia. 3. Bilateral hilar lymphadenopathy, likely reactive. 4. Cardiac enlargement with evidence of right heart failure. Electronically Signed   By: Burman Nieves M.D.   On: 07/27/2017 03:55   Dg Chest Port 1 View  Result Date: 07/26/2017 CLINICAL DATA:  Sepsis EXAM: PORTABLE CHEST 1 VIEW COMPARISON:  CT chest of 11/22/2015 and chest x-ray of the same day FINDINGS: There is patchy airspace disease bilaterally right greater than left. No definite effusion is seen.  These changes most likely reflect multifocal pneumonia with congestive heart failure less likely. The heart is within upper limits normal. No acute bony abnormality is seen. IMPRESSION: Patchy airspace disease bilaterally, right greater than left, most consistent with multifocal pneumonia. Electronically Signed   By: Dwyane Dee M.D.   On: 07/26/2017 17:08    Cardiac Studies      Patient Profile     83 y.o. male with advanced Parkinsons, advanced dementia Admitted with pneumonia and sepsis , found to have + troponin   Assessment & Plan    1. NSTEMI:   LV function is mildly reduced by echo He is not a candidate for interventional procedures.  Continue supportive care and treatment of his sepsis.  Will sign off .  Call for questions   For questions or updates, please contact [facility] Please consult [email or web address] for contact info under Cardiology/STEMI.      Signed, Kristeen Miss, MD  07/27/2017, 11:40 AM

## 2017-07-27 NOTE — Progress Notes (Signed)
Progress Note  Patient Name: Martin Lowe Date of Encounter: 07/27/2017  Primary Cardiologist: New- Quince Santana  Subjective   Pt is awake and alert. He is answering simple questions this am. He denies chest pain or shortness of breath. Complains of a cough.   Inpatient Medications    Scheduled Meds: . chlorhexidine  15 mL Mouth Rinse BID  . heparin subcutaneous  5,000 Units Subcutaneous Q8H  . lactulose  300 mL Rectal Once  . mouth rinse  15 mL Mouth Rinse q12n4p   Continuous Infusions: . sodium chloride    . ceFEPime (MAXIPIME) IV    . vancomycin     PRN Meds: sodium chloride   Vital Signs    Vitals:   07/27/17 0700 07/27/17 0723 07/27/17 0800 07/27/17 0900  BP: 126/70  110/73 107/72  Pulse: 65  67 72  Resp: (!) 24  (!) 24 (!) 27  Temp:  98.3 F (36.8 C)    TempSrc:  Oral    SpO2: 96%  96% 95%  Weight:      Height:        Intake/Output Summary (Last 24 hours) at 07/27/17 1116 Last data filed at 07/27/17 0900  Gross per 24 hour  Intake           4256.2 ml  Output              300 ml  Net           3956.2 ml   Filed Weights   07/26/17 1545 07/26/17 2227 07/27/17 0405  Weight: 156 lb (70.8 kg) 148 lb 13 oz (67.5 kg) 150 lb 2.1 oz (68.1 kg)    Telemetry    Sinus rhythm in the 60's - Personally Reviewed  ECG    NSR 68 bpm, incomplete RBBB, nonspecific St changes infer - Personally Reviewed  Physical Exam   GEN: Chronically ill appearing male. No acute distress.   Neck: No JVD Cardiac: RRR, no murmurs, rubs, or gallops.  Respiratory: Clear to auscultation bilaterally except for scattered rhonchi.  GI: Soft, nontender, non-distended  MS: No edema; No deformity. Neuro:  Unable to assess cranial nerves. Pt keeping eyes closed. Answers few simple yes/no questions.  Psych: Calm, but has mittens on hands to prevent pulling at lines.   Labs    Chemistry Recent Labs Lab 07/26/17 1546 07/27/17 0203 07/27/17 0403  NA 135 136 136  K 4.6 4.4 4.3  CL  102 109 109  CO2 22 19* 20*  GLUCOSE 106* 140* 122*  BUN 38* 40* 38*  CREATININE 1.70* 1.37* 1.31*  CALCIUM 8.7* 8.1* 8.1*  PROT 6.4*  --   --   ALBUMIN 3.2* 2.7*  --   AST 84*  --   --   ALT 10*  --   --   ALKPHOS 87  --   --   BILITOT 1.9*  --   --   GFRNONAA 36* 47* 49*  GFRAA 42* 54* 57*  ANIONGAP Hematology Recent Labs Lab 07/26/17 1546  WBC 11.7*  RBC 4.16*  HGB 12.4*  HCT 38.5*  MCV 92.5  MCH 29.8  MCHC 32.2  RDW 13.8  PLT 292    Cardiac Enzymes Recent Labs Lab 07/26/17 2247 07/27/17 0403  TROPONINI 5.37* 6.02*    Recent Labs Lab 07/26/17 1558  TROPIPOC 3.12*     BNP Recent Labs Lab 07/26/17 1546  BNP 939.5*     DDimer  Recent  Labs Lab 07/26/17 2247  DDIMER 2.12*     Radiology    Ct Head Wo Contrast  Result Date: 07/26/2017 CLINICAL DATA:  Minor head trauma. Fever. Parkinson's disease and dementia. EXAM: CT HEAD WITHOUT CONTRAST TECHNIQUE: Contiguous axial images were obtained from the base of the skull through the vertex without intravenous contrast. COMPARISON:  Head CT 11/22/2015 FINDINGS: Brain: No mass lesion, intraparenchymal hemorrhage or extra-axial collection. No evidence of acute cortical infarct. There is periventricular hypoattenuation compatible with chronic microvascular disease. There is generalized atrophy, unchanged from the prior study. Vascular: No hyperdense vessel or unexpected calcification. Skull: No calvarial fracture. There is multifocal soft tissue emphysema within the bilateral masticator spaces, left parotid space and in the left posterior paravertebral soft tissues. There are also foci of gas within the epidural venous plexus. Sinuses/Orbits: There is complete opacification of the right frontal and ethmoid sinuses. Normal orbits. IMPRESSION: 1. No acute intracranial abnormality. 2. Generalized atrophy with chronic ischemic microangiopathy. 3. Multiple foci of soft tissue gas within the bilateral subcutaneous  fascial tissues, bilateral masticator spaces, left parotid space and in the epidural venous plexus. This is of uncertain etiology. Is there a history of recent trauma? Given the presence of air within the epidural venous plexus it is possible that the other foci of gas are also within very small veins, in which case it may be secondary to IV infusion. Electronically Signed   By: Deatra Robinson M.D.   On: 07/26/2017 22:44   Ct Angio Chest Pe W Or Wo Contrast  Result Date: 07/27/2017 CLINICAL DATA:  Confusion. Shortness of breath. Elevated D-dimer. Positive for pneumonia. EXAM: CT ANGIOGRAPHY CHEST WITH CONTRAST TECHNIQUE: Multidetector CT imaging of the chest was performed using the standard protocol during bolus administration of intravenous contrast. Multiplanar CT image reconstructions and MIPs were obtained to evaluate the vascular anatomy. CONTRAST:  78 mL Isovue 370 COMPARISON:  Chest radiograph 08/03/2017.  CT chest 11/22/2015 FINDINGS: Cardiovascular: Good opacification of the central and segmental pulmonary arteries. No focal filling defects. No evidence of significant pulmonary embolus. Cardiac enlargement. Reflux of contrast material into the hepatic veins suggesting passive congestion due to right heart failure. Coronary artery and aortic calcifications. No pericardial effusions. Normal caliber thoracic aorta. Mediastinum/Nodes: Bilateral hilar prominence may indicate hilar lymphadenopathy. Esophagus is decompressed. Lungs/Pleura: Diffuse patchy airspace disease scattered throughout both lungs with predominant perihilar distribution. There is severe peribronchial thickening bilaterally. Multiple air bronchograms. Changes are most likely to represent multifocal bronchial pneumonia. Other considerations could include sarcoidosis, cryptogenic organizing pneumonia, or possibly atypical pneumonia such as TB. No pleural effusions. No pneumothorax. Focal narrowing of the bronchus intermedius, likely  inflammatory. Focal areas of narrowing of more distal bronchi bilaterally, also likely inflammatory. Upper Abdomen: Gallbladder appears to be distended. No definite acute process demonstrated in the visualized upper abdomen. Musculoskeletal: Degenerative changes in the spine. No destructive bone lesions. Review of the MIP images confirms the above findings. IMPRESSION: 1. No evidence of significant pulmonary embolus. 2. Diffuse patchy airspace disease throughout the lungs with prominent peribronchial thickening causing areas of bronchial narrowing. This likely represents multifocal bronchopneumonia. 3. Bilateral hilar lymphadenopathy, likely reactive. 4. Cardiac enlargement with evidence of right heart failure. Electronically Signed   By: Burman Nieves M.D.   On: 07/27/2017 03:55   Dg Chest Port 1 View  Result Date: 07/26/2017 CLINICAL DATA:  Sepsis EXAM: PORTABLE CHEST 1 VIEW COMPARISON:  CT chest of 11/22/2015 and chest x-ray of the same day FINDINGS: There is patchy airspace  disease bilaterally right greater than left. No definite effusion is seen. These changes most likely reflect multifocal pneumonia with congestive heart failure less likely. The heart is within upper limits normal. No acute bony abnormality is seen. IMPRESSION: Patchy airspace disease bilaterally, right greater than left, most consistent with multifocal pneumonia. Electronically Signed   By: Dwyane Dee M.D.   On: 07/26/2017 17:08    Cardiac Studies   Echocardiogram 07/27/17 Study Conclusions  - Left ventricle: Septal and posterior lateral hypokinesis. The   cavity size was mildly dilated. Wall thickness was normal.   Systolic function was mildly to moderately reduced. The estimated   ejection fraction was in the range of 40% to 45%. - Left atrium: The atrium was mildly dilated. - Atrial septum: A patent foramen ovale cannot be excluded.   Patient Profile     83 y.o. male  with a hx of Parkinson's disease, BPH,  dementia, failure to thrive, and frequent falls admitted for pneumonia and found to have afib with RVR. Spontaneously converted to sinus rhythm. Also had elevated troponins. Pt has dementia and is non-communicative.   Assessment & Plan    1. Atrial fibrillation -Admitted for multifocal pneumonia and found to be in afib with RVR. Spontaneously converted to sinus rhythm and continues to maintain SR in the 60's with treatment of PNA with IV fluids and antibiotics.  -No hx of cardiac disease. Has severe Parkinson's and dementia.  -His CHA2DS2/VAS Stroke Risk Score is 2 for advanced age but with his frequent falls, dementia and advanced disease state, he would not be a good candidate for anticoagulation.  -His episode of atrial fib was likely triggered by acute pneumonia and hopefully will not be a problem after he recovers from this acute illness.   2. Non-STEMI  His initial troponin was 3.12 (POC) and has slowly trended up to 6.02. His troponin elevations may be due to demand ischemia in setting of sepsis syndrome and afib with RVR, however, the levels are higher than expected with demand ischemia.  An echocardiogram showed mild-moderately reduced LV systolic function with EF 40-45% and septal and posterior lateral hypokinesis. Pt currently denies chest pain. The patient may have had an ischemic event, but he is not currently a candidate for invasive or interventional procedures.   3. Pneumonia with sepsis syndrome -Management per IM. Seems to be improving.      4. Acute renal insufficiency -SCr 1.7 on admission, improving, 1.31 today. Getting IV fluids. Pt had not been taking in oral intake for the previous several days before presentation.    For questions or updates, please contact [facility] Please consult [email or web address] for contact info under Cardiology/STEMI.      Signed, Berton Bon, NP  07/27/2017, 11:16 AM    Attending Note:   The patient was seen and examined.  Agree with  assessment and plan as noted above.  Changes made to the above note as needed.  Patient seen and independently examined with Lizabeth Leyden, NP .   We discussed all aspects of the encounter. I agree with the assessment and plan as stated above.  See my progress note from same day    I have spent a total of 40 minutes with patient reviewing hospital  notes , telemetry, EKGs, labs and examining patient as well as establishing an assessment and plan that was discussed with the patient. > 50% of time was spent in direct patient care.  Will sign off. Call for questions   Deloris Ping.  Fran Lowes., MD, [facility] 07/28/2017, 4:18 PM [address]  [address] Office [phone] Pager [phone]

## 2017-07-27 NOTE — Progress Notes (Signed)
CRITICAL VALUE ALERT  Critical Value:  Troponin 5.37 Lactic acid 2.3  Date & Time Notied: 07/27/17 12:38 AM  Provider Notified: Dr. Carlota Raspberry  Orders Received/Actions taken:

## 2017-07-27 NOTE — Evaluation (Signed)
Clinical/Bedside Swallow Evaluation Patient Details  Name: Martin Lowe MRN: [id number] Date of Birth: 07-12-1934  Today's Date: 07/27/2017 Time: SLP Start Time (ACUTE ONLY): 1440 SLP Stop Time (ACUTE ONLY): 1520 SLP Time Calculation (min) (ACUTE ONLY): 40 min  Past Medical History:  Past Medical History:  Diagnosis Date  . Atrial fibrillation (HCC)   . BPH (benign prostatic hyperplasia)   . Dementia   . Failure to thrive in adult   . Frequent falls   . Hypothyroidism   . Kidney stone   . Parkinson disease (HCC)   . Urinary incontinence    Past Surgical History:  Past Surgical History:  Procedure Laterality Date  . BLADDER SURGERY    . HERNIA REPAIR    . NEPHRECTOMY    . PROSTATE SURGERY     HPI:  83 y.o. male with hx of dementia and Parkinson's dz, FTT  admitted from [facility] with decreased activity, poor PO intake, difficulty swallowing, pna.    Assessment / Plan / Recommendation Clinical Impression  Pt presents with dysphagia with + s/s of aspiration with thin and nectar-thick liquids.  Pt will need an instrumental swallow study prior to initiating diet, particularly due to the higher risk of silent aspiration with a Parkinson's diagnosis.  D/W his wife the nature of dysphagia associated with PD and dementia; we had a brief discussion about possible outcomes, including aspiration.  We discussed the difficulties of PEGs and adverse outcomes associated with their use in the dementia population.  Recommend continuing NPO except meds crushed in puree; MBS next date.  Mrs. Rosevear agrees with recommendations.  SLP Visit Diagnosis: Dysphagia, unspecified (R13.10)    Aspiration Risk       Diet Recommendation   npo  Medication Administration: Crushed with puree    Other  Recommendations Oral Care Recommendations: Oral care QID   Follow up Recommendations  (tba)      Frequency and Duration            Prognosis        Swallow Study   General HPI: 83 y.o.  male with hx of dementia and Parkinson's dz, FTT  admitted from [facility] with decreased activity, poor PO intake, difficulty swallowing, pna.  Type of Study: Bedside Swallow Evaluation Previous Swallow Assessment: clinical swallow eval at [facility] at least five years ago per wife Diet Prior to this Study: NPO Temperature Spikes Noted: No Respiratory Status: Room air History of Recent Intubation: No Behavior/Cognition: Alert;Confused Oral Cavity Assessment: Dried secretions Oral Care Completed by SLP: Yes Oral Cavity - Dentition: Adequate natural dentition Vision: Functional for self-feeding Self-Feeding Abilities: Able to feed self;Needs assist Patient Positioning: Upright in bed Baseline Vocal Quality: Normal Volitional Cough: Cognitively unable to elicit Volitional Swallow: Unable to elicit    Oral/Motor/Sensory Function Overall Oral Motor/Sensory Function:  (+ symmetry)   Ice Chips Ice chips: Within functional limits   Thin Liquid Thin Liquid: Impaired Presentation: Cup Pharyngeal  Phase Impairments: Cough - Delayed    Nectar Thick Nectar Thick Liquid: Impaired Presentation: Spoon Pharyngeal Phase Impairments: Wet Vocal Quality   Honey Thick Honey Thick Liquid: Not tested   Puree Puree: Within functional limits Presentation: Spoon   Solid   GO   Solid: Not tested        Blenda Mounts Laurice 07/27/2017,3:29 PM  Marchelle Folks L. Samson Frederic, [place] CCC/SLP Pager [phone]

## 2017-07-27 NOTE — Progress Notes (Signed)
ANTICOAGULATION CONSULT NOTE - Initial Consult  Pharmacy Consult for heparin Indication: r/o PE  Allergies  Allergen Reactions  . Ciprofloxacin Hives and Rash  . Nitrofurantoin Rash    Patient Measurements: Height:  (177.8 cm) Weight: 148 lb 13 oz (67.5 kg) IBW/kg (Calculated) : 73  Vital Signs: Temp: 97.5 F (36.4 C) (09/21 0016) Temp Source: Axillary (09/21 0016) BP: 90/69 (09/20 2245) Pulse Rate: 71 (09/20 2245)  Labs:  Recent Labs  07/26/17 1546  HGB 12.4*  HCT 38.5*  PLT 292  LABPROT 16.5*  INR 1.35  CREATININE 1.70*    Estimated Creatinine Clearance: 32.5 mL/min (A) (by C-G formula based on SCr of 1.7 mg/dL (H)).   Medical History: Past Medical History:  Diagnosis Date  . Atrial fibrillation (HCC)   . BPH (benign prostatic hyperplasia)   . Dementia   . Failure to thrive in adult   . Frequent falls   . Hypothyroidism   . Kidney stone   . Parkinson disease (HCC)   . Urinary incontinence     Medications:  Prescriptions Prior to Admission  Medication Sig Dispense Refill Last Dose  . acetaminophen (TYLENOL) 325 MG tablet Take 650 mg by mouth every 4 (four) hours as needed for mild pain.   Taking  . carbidopa-levodopa (SINEMET IR) 25-100 MG tablet Take 1 tablet by mouth 3 (three) times daily. Take at 8A, 11A, 2P   Taking  . carbidopa-levodopa (SINEMET IR) 25-250 MG tablet Take by mouth. Take @ 8 am, 11 am, 2 pm, 5 pm and 8 pm.   Take 1/2 tab at 10 pm.   Taking  . cholecalciferol (VITAMIN D) 1000 units tablet Take 2,000 Units by mouth daily. 2 tabs to = 2000 units   Taking  . Melatonin 5 MG TABS Take 1 tablet by mouth at bedtime. Give 1/2 hour prior to bedtime   Taking  . Pimavanserin Tartrate (NUPLAZID) 17 MG TABS Take 34 mg by mouth every morning.    Taking  . Polyethylene Glycol 3350 (MIRALAX PO) Take 17 g by mouth every morning.   Taking  . Psyllium (METAMUCIL FIBER PO) Take 1 scoop by mouth daily.   Taking  . QUEtiapine (SEROQUEL) 100 MG  tablet Take 100 mg by mouth at bedtime.   Taking  . solifenacin (VESICARE) 10 MG tablet Take 10 mg by mouth at bedtime.    Taking  . tamsulosin (FLOMAX) 0.4 MG CAPS capsule Take 0.4 mg by mouth at bedtime.   Taking  . UNABLE TO FIND Med Name: Lavender oil. Apply one drop of lavender oil behind each ear   Taking  . UNABLE TO FIND Med Name: Mighty shake by mouth daily   Taking   Scheduled:  . heparin  5,000 Units Subcutaneous Q8H  . hydrocortisone sodium succinate  50 mg Intravenous Q6H  . mouth rinse  15 mL Mouth Rinse BID   Infusions:  . sodium chloride    . azithromycin 500 mg (07/26/17 2337)  . ceFEPime (MAXIPIME) IV    . phenylephrine (NEO-SYNEPHRINE) Adult infusion 20 mcg/min (07/26/17 1935)  . vancomycin      Assessment: 83yo male admitted w/ PNA/sepsis, now w/ elevated D-dimer, awaiting CT PE study, to begin heparin.  Goal of Therapy:  Heparin level 0.3-0.7 units/ml Monitor platelets by anticoagulation protocol: Yes   Plan:  Rec'd SQ heparin ~49min ago; will give small heparin bolus of 1000 units followed by gtt at 900 units/hr and monitor heparin levels and CBC.  Barkley Bruns  Annice Needy, PharmD, BCPS  07/27/2017,12:16 AM

## 2017-07-28 ENCOUNTER — Inpatient Hospital Stay (HOSPITAL_COMMUNITY): Payer: Medicare Other

## 2017-07-28 DIAGNOSIS — F0281 Dementia in other diseases classified elsewhere with behavioral disturbance: Secondary | ICD-10-CM

## 2017-07-28 DIAGNOSIS — I480 Paroxysmal atrial fibrillation: Secondary | ICD-10-CM

## 2017-07-28 DIAGNOSIS — I4891 Unspecified atrial fibrillation: Secondary | ICD-10-CM

## 2017-07-28 DIAGNOSIS — G20 Parkinson's disease: Secondary | ICD-10-CM

## 2017-07-28 DIAGNOSIS — A419 Sepsis, unspecified organism: Principal | ICD-10-CM

## 2017-07-28 LAB — URINE CULTURE: Culture: <10000 — AB

## 2017-07-28 LAB — CBC
HEMATOCRIT: 35.6 % — AB (ref 39.0–52.0)
Hemoglobin: 11.5 g/dL — ABNORMAL LOW (ref 13.0–17.0)
MCH: 29.8 pg (ref 26.0–34.0)
MCHC: 32.3 g/dL (ref 30.0–36.0)
MCV: 92.2 fL (ref 78.0–100.0)
PLATELETS: 287 10*3/uL (ref 150–400)
RBC: 3.86 MIL/uL — AB (ref 4.22–5.81)
RDW: 14.2 % (ref 11.5–15.5)
WBC: 10 10*3/uL (ref 4.0–10.5)

## 2017-07-28 LAB — BASIC METABOLIC PANEL
ANION GAP: 7 (ref 5–15)
BUN: 38 mg/dL — AB (ref 6–20)
CHLORIDE: 109 mmol/L (ref 101–111)
CO2: 24 mmol/L (ref 22–32)
Calcium: 8.5 mg/dL — ABNORMAL LOW (ref 8.9–10.3)
Creatinine, Ser: 1.19 mg/dL (ref 0.61–1.24)
GFR calc Af Amer: >60 mL/min (ref >60)
GFR, EST NON AFRICAN AMERICAN: 55 mL/min — AB (ref >60)
GLUCOSE: 82 mg/dL (ref 65–99)
POTASSIUM: 3.7 mmol/L (ref 3.5–5.1)
Sodium: 140 mmol/L (ref 135–145)

## 2017-07-28 LAB — HEPARIN LEVEL (UNFRACTIONATED): Heparin Unfractionated: <0.1 IU/mL — ABNORMAL LOW (ref 0.30–0.70)

## 2017-07-28 LAB — LEGIONELLA PNEUMOPHILA SEROGP 1 UR AG: L. PNEUMOPHILA SEROGP 1 UR AG: NEGATIVE

## 2017-07-28 LAB — GLUCOSE, CAPILLARY
GLUCOSE-CAPILLARY: 113 mg/dL — AB (ref 65–99)
Glucose-Capillary: 85 mg/dL (ref 65–99)
Glucose-Capillary: 114 mg/dL — ABNORMAL HIGH (ref 65–99)

## 2017-07-28 MED ORDER — AMIODARONE HCL IN DEXTROSE 360-4.14 MG/200ML-% IV SOLN
30.0000 mg/h | INTRAVENOUS | Status: DC
  Administered 2017-07-28 – 2017-08-01 (×6): 30 mg/h via INTRAVENOUS
  Filled 2017-07-28 (×8): qty 200

## 2017-07-28 MED ORDER — DEXTROSE-NACL 5-0.45 % IV SOLN
INTRAVENOUS | Status: DC
  Administered 2017-07-28: 11:00:00 via INTRAVENOUS
  Administered 2017-07-29: 75 mL/h via INTRAVENOUS
  Administered 2017-08-02 – 2017-08-03 (×2): via INTRAVENOUS

## 2017-07-28 MED ORDER — AMIODARONE HCL IN DEXTROSE 360-4.14 MG/200ML-% IV SOLN
60.0000 mg/h | INTRAVENOUS | Status: AC
  Administered 2017-07-28 (×2): 60 mg/h via INTRAVENOUS

## 2017-07-28 MED ORDER — AMIODARONE HCL IN DEXTROSE 360-4.14 MG/200ML-% IV SOLN
INTRAVENOUS | Status: AC
  Filled 2017-07-28: qty 200

## 2017-07-28 MED ORDER — RESOURCE THICKENUP CLEAR PO POWD
Freq: Once | ORAL | Status: AC
  Administered 2017-07-28: 16:00:00 via ORAL
  Filled 2017-07-28: qty 125

## 2017-07-28 MED ORDER — AMIODARONE LOAD VIA INFUSION
150.0000 mg | Freq: Once | INTRAVENOUS | Status: AC
  Administered 2017-07-28: 150 mg via INTRAVENOUS
  Filled 2017-07-28: qty 83.34

## 2017-07-28 NOTE — Progress Notes (Signed)
PULMONARY / CRITICAL CARE MEDICINE   Name: Martin Lowe MRN: [id number] DOB: Oct 01, 1934    ADMISSION DATE:  07/26/2017 CONSULTATION DATE:  07/26/2017  REFERRING MD:  Dr. Donnald Garre  CHIEF COMPLAINT:  Fever, cough  HISTORY OF PRESENT ILLNESS:   83 year old with past medical history of Parkinson's disease, BPH, dementia, hypothyroidism, failure to thrive, urinary incontinence, HOH, and frequent falls who presented to [facility] ER with complaints of fever and cough.   Patient is a resident at [facility] since March 2018.  He goes by "Martin Lowe".  He is normally able to communicate but confused and uses a wheelchair for mobilization but often attempts to walk causing him to have multiple falls.  His family reports increased dysphagia and oral intake since Tuesday and today with increased lethargy.  Noted to have a 102.7 fever and CXR performed at SNF reported a right pneumonia.  He was sent to the ER for further treatment.  In the ER, patient was found to be in rapid Afib w/ RVR and initially normotensive, oxygen sats 83% on RA, and febrile.  CXR noted for multifocal pneumonia right worse than left.  Head CT negative for acute process.  Labs significant for WBC 11.7, LA 2.55 -> 1.48, sCr 1.7, +UTI, BNP 939, and troponin 3.12 with EKG showing diffuse ST depression and Rapid Afib.  He was treated with 3L NS bolus, vancomycin and cefepime.  Cardiology consulted.  He spontaneously converted to NSR in the 70's but progressively became more hypotensive requiring low-dose vasopressor.   Therefore, PCCM to admit.     SUBJECTIVE:  Awake and alert c/o dry mouth awaiting swallowing study / no sob or cough/ no wob on RA     VITAL SIGNS: BP 110/60 (BP Location: Left Arm)   Pulse 70   Temp 97.9 F (36.6 C) (Oral)   Resp 16   Ht  (1.778 m)   Wt 149 lb (67.6 kg)   SpO2 95%   BMI 21.38 kg/m        INTAKE / OUTPUT: I/O last 3 completed shifts: In: 1943.6 [I.V.:443.6; IV Piggyback:1500] Out: 1150  [Urine:1150]  PHYSICAL EXAMINATION:  General:  Thin frail male NAD HEENT: NO jvd/lan/ mucosa very dry  WUJ:WJXB affect Neuro: follows commands CV: NSR RRR PULM: even/non-labored, lungs clear to A and P  JY:NWGN, non-tender, bsx4 active , NPO Extremities: warm/dry,-edema  Skin: no rashes or lesions   LABS:  BMET  Recent Labs Lab 07/27/17 0203 07/27/17 0403 07/28/17 0635  NA 136 136 140  K 4.4 4.3 3.7  CL 109 109 109  CO2 19* 20* 24  BUN 40* 38* 38*  CREATININE 1.37* 1.31* 1.19  GLUCOSE 140* 122* 82    Electrolytes  Recent Labs Lab 07/26/17 2247 07/27/17 0203 07/27/17 0403 07/28/17 0635  CALCIUM  --  8.1* 8.1* 8.5*  MG 1.9  --  2.0  --   PHOS  --  3.0 2.8  --     CBC  Recent Labs Lab 07/26/17 1546 07/28/17 0635  WBC 11.7* 10.0  HGB 12.4* 11.5*  HCT 38.5* 35.6*  PLT 292 287    Coag's  Recent Labs Lab 07/26/17 1546  INR 1.35    Sepsis Markers  Recent Labs Lab 07/26/17 1922 07/26/17 2317 07/27/17 0208 07/27/17 0403  LATICACIDVEN 1.47 2.3* 1.7  --   PROCALCITON  --   --   --  2.83    ABG  Recent Labs Lab 07/26/17 2114  PHART 7.420  PCO2ART 27.5*  PO2ART 85.0    Liver Enzymes  Recent Labs Lab 07/26/17 1546 07/27/17 0203  AST 84*  --   ALT 10*  --   ALKPHOS 87  --   BILITOT 1.9*  --   ALBUMIN 3.2* 2.7*    Cardiac Enzymes  Recent Labs Lab 07/26/17 2247 07/27/17 0403  TROPONINI 5.37* 6.02*    Glucose  Recent Labs Lab 07/28/17 0739  GLUCAP 85    Imaging Dg Chest Port 1 View  Result Date: 07/28/2017 CLINICAL DATA:  Respiratory failure EXAM: PORTABLE CHEST 1 VIEW COMPARISON:  July 26, 2017 FINDINGS: There is a skin fold over the upper right chest. No pneumothorax. Mild cardiomegaly. The hila and mediastinum are normal. Bilateral pulmonary opacities most prominent centrally have mildly worsened in the interval. No other interval changes. IMPRESSION: Mild worsening of the bilateral multifocal infiltrates,  most prominent centrally. Electronically Signed   By: Gerome Sam III M.D   On: 07/28/2017 09:06   STUDIES:  CT head 9/20 >>areas of gas in facial tissue??? TTE 9/21 >>> Left ventricle: Septal and posterior lateral hypokinesis. The   cavity size was mildly dilated. Wall thickness was normal.   Systolic function was mildly to moderately reduced. The estimated   ejection fraction was in the range of 40% to 45%. - Left atrium: The atrium was mildly dilated. - Atrial septum: A patent foramen ovale cannot be excluded. 9/20 ct a >> neg PE  Micro/CULTURES: 9/20 BCx 2 >> 9/21 UC >> Procalcitonin  9/21 :  2.83 ' Urine strep 9/20  Neg   ANTIBIOTICS: Vancomycin 9/20 >> Cefepime 9/20 >> Azithromycin 9/20 only  SIGNIFICANT EVENTS: 9/20 Admit  LINES/TUBES: PIV x 2  9/21 left radial aline > 9/21  DISCUSSION: 46 yoM from SNF w/PMH of parkinson's and dementia who presented with fever, cough x 1 day, and difficulty swallowing, poor po intake, and increasing lethargy since Tuesday found to have multifocal PNA, UTI and rapid Afib- now SR .  Required low pressors after 3L NS. Now off pressors. HD stable.   ASSESSMENT / PLAN:  PULMONARY A: Multifocal PNA +/- Aspiration +/- HCAP Dysphagia P:   On room air 9/22 See ID Will need SLP when appropriate , most likely aspiration pan per cxr CTA neg PE therefore dc heparin if OK with cards    CARDIOVASCULAR A:  Afib w/RVR - new, converted to SR after IVF Shock- likely septic +/- r/o component of cardiogenic, 9/21 off pressors Positive troponin with abnormal EKG/ RBBB- possible r/t demand ischemia rapid rate P:  Tele monitoring Cardiology following with recs for no heparin for CHADS2VASC score of 2 (age) Not a candidate for chronic anticoagulation given fall risk/hx    RENAL Lab Results  Component Value Date   CREATININE 1.19 07/28/2017   CREATININE 1.31 (H) 07/27/2017   CREATININE 1.37 (H) 07/27/2017    Recent Labs Lab  07/27/17 0203 07/27/17 0403 07/28/17 0635  K 4.4 4.3 3.7     A:   AKI- baseline sCr 1.3 (5/18) Lactic acidosis- resolved 9/21 UTI Hx BPH, urinary incontinence  P:   Hold further IVFs Trend BMP / mag/ phos urinary output/ daily weights Replace electrolytes as indicated Place condom cath, watch I/Os for urinary retention See ID  Hold pre-admit vesicare and flomax  Started maint IVfluids am 9/22  GASTROINTESTINAL A:   Failure to thrive Protein calorie malnutrition R/o dysphagia P:   NPO except ice chips ok 9/22  SLP when appropriate Family reports  patient would not want a feeding tube  HEMATOLOGIC A:   Leukocytosis P:  Trend CBC   INFECTIOUS A:   Multilobar PNA +/- Aspiration +/- HCAP  UTI Fever   P:   Follow cultures, add sputum and urine Continue vanc and cefepime Trend WBC and fever curve  ENDOCRINE A:   ? Hx of hypothyroidism R/o adrenal insufficiency P:    TSH 2.067 and cortisol 33.3    NEUROLOGIC A:   Acute encephalopathy- likely r/t to sepsis Hx of non-tremorous Parkinson's and Dementia  P:   Head CT read No acute abnormality but with facial tissue gas foci ?etiology  Frequent Neuro checks Hold pre-admit sinemet, Seroquel, pimavanserin    FAMILY  - Updates son in law at bedside requesting comfort measures eg ice chips/ maint fluids pending completion of ice chips  [facility] to pick up am 9/23 per Dr Joseph Art    Sandrea Hughs, MD Pulmonary and Critical Care Medicine [facility] Cell [phone] After 5:30 PM or weekends, use Beeper [phone]

## 2017-07-28 NOTE — Plan of Care (Signed)
Problem: Safety: Goal: Ability to remain free from injury will improve Outcome: Progressing No injuries or skin breakdown this shift. Fall risk bundle in place. Pt repositioned q2h with bony prominences supported with pillows.   Problem: Pain Managment: Goal: General experience of comfort will improve Outcome: Progressing Pt did not verbalize that he was in pain or show physiological signs of pain. Will continue to monitor.   Problem: Nutrition: Goal: Adequate nutrition will be maintained Outcome: Progressing Pt is NPO at this time.

## 2017-07-28 NOTE — Progress Notes (Addendum)
Progress Note  Patient Name: Martin Lowe Date of Encounter: 07/28/2017  Primary Cardiologist: Katherina Right, MD   Subjective   Pt developed recurrent AFib RVR this AM.  He reported chest pressure to nsg staff.  We ordered IV amio bolus/infusion and he converted back to sinus rhythm w/in 15 mins of bolus.  He currently has no complaints.  Family @ bedside.  Questions answered.  Inpatient Medications    Scheduled Meds: . chlorhexidine  15 mL Mouth Rinse BID  . heparin subcutaneous  5,000 Units Subcutaneous Q8H  . mouth rinse  15 mL Mouth Rinse q12n4p   Continuous Infusions: . amiodarone    . sodium chloride    . amiodarone 60 mg/hr (07/28/17 0933)   Followed by  . amiodarone    . ceFEPime (MAXIPIME) IV Stopped (07/27/17 1645)  . dextrose 5 % and 0.45% NaCl    . vancomycin Stopped (07/27/17 1717)   PRN Meds: sodium chloride   Vital Signs    Vitals:   07/28/17 0621 07/28/17 0626 07/28/17 0741 07/28/17 0800  BP:  112/66 117/69 110/60  Pulse: 71 69 70   Resp: Temp:   97.9 F (36.6 C)   TempSrc:   Oral   SpO2: 94% 94% 96% 95%  Weight:      Height:        Intake/Output Summary (Last 24 hours) at 07/28/17 0952 Last data filed at 07/28/17 0625  Gross per 24 hour  Intake            287.4 ml  Output              850 ml  Net           -562.6 ml   Filed Weights   07/26/17 2227 07/27/17 0405 07/28/17 0427  Weight: 148 lb 13 oz (67.5 kg) 150 lb 2.1 oz (68.1 kg) 149 lb (67.6 kg)    Physical Exam   GEN: frail, in no acute distress.  HEENT: Grossly normal.  Neck: Supple, no JVD, carotid bruits, or masses. Cardiac: RRR, distant heart sounds, no murmurs, rubs, or gallops. No clubbing, cyanosis, edema.  Radials/DP/PT 2+ and equal bilaterally.  Respiratory:  Respirations regular and unlabored, crackles 1/2 way up bilat. GI: Soft, nontender, nondistended, BS + x 4. MS: no deformity or atrophy. Skin: warm and dry, no rash. Neuro:  Strength and sensation are  intact. Psych: disoriented to time/place. Answers some yes/no questions appropriately.  flat affect.  Labs    Chemistry Recent Labs Lab 07/26/17 1546 07/27/17 0203 07/27/17 0403 07/28/17 0635  NA 135 136 136 140  K 4.6 4.4 4.3 3.7  CL 102 109 109 109  CO2 22 19* 20* 24  GLUCOSE 106* 140* 122* 82  BUN 38* 40* 38* 38*  CREATININE 1.70* 1.37* 1.31* 1.19  CALCIUM 8.7* 8.1* 8.1* 8.5*  PROT 6.4*  --   --   --   ALBUMIN 3.2* 2.7*  --   --   AST 84*  --   --   --   ALT 10*  --   --   --   ALKPHOS 87  --   --   --   BILITOT 1.9*  --   --   --   GFRNONAA 36* 47* 49* 55*  GFRAA 42* 54* 57* >60  ANIONGAP Hematology Recent Labs Lab 07/26/17 1546 07/28/17 0635  WBC 11.7* 10.0  RBC 4.16*  3.86*  HGB 12.4* 11.5*  HCT 38.5* 35.6*  MCV 92.5 92.2  MCH 29.8 29.8  MCHC 32.2 32.3  RDW 13.8 14.2  PLT 292 287    Cardiac Enzymes Recent Labs Lab 07/26/17 2247 07/27/17 0403  TROPONINI 5.37* 6.02*    Recent Labs Lab 07/26/17 1558  TROPIPOC 3.12*     BNP Recent Labs Lab 07/26/17 1546  BNP 939.5*     DDimer  Recent Labs Lab 07/26/17 2247  DDIMER 2.12*     Radiology    Ct Head Wo Contrast  Result Date: 07/26/2017 CLINICAL DATA:  Minor head trauma. Fever. Parkinson's disease and dementia. EXAM: CT HEAD WITHOUT CONTRAST TECHNIQUE: Contiguous axial images were obtained from the base of the skull through the vertex without intravenous contrast. COMPARISON:  Head CT 11/22/2015 FINDINGS: Brain: No mass lesion, intraparenchymal hemorrhage or extra-axial collection. No evidence of acute cortical infarct. There is periventricular hypoattenuation compatible with chronic microvascular disease. There is generalized atrophy, unchanged from the prior study. Vascular: No hyperdense vessel or unexpected calcification. Skull: No calvarial fracture. There is multifocal soft tissue emphysema within the bilateral masticator spaces, left parotid space and in the left posterior  paravertebral soft tissues. There are also foci of gas within the epidural venous plexus. Sinuses/Orbits: There is complete opacification of the right frontal and ethmoid sinuses. Normal orbits. IMPRESSION: 1. No acute intracranial abnormality. 2. Generalized atrophy with chronic ischemic microangiopathy. 3. Multiple foci of soft tissue gas within the bilateral subcutaneous fascial tissues, bilateral masticator spaces, left parotid space and in the epidural venous plexus. This is of uncertain etiology. Is there a history of recent trauma? Given the presence of air within the epidural venous plexus it is possible that the other foci of gas are also within very small veins, in which case it may be secondary to IV infusion. Electronically Signed   By: Deatra Robinson M.D.   On: 07/26/2017 22:44   Ct Angio Chest Pe W Or Wo Contrast  Result Date: 07/27/2017 CLINICAL DATA:  Confusion. Shortness of breath. Elevated D-dimer. Positive for pneumonia. EXAM: CT ANGIOGRAPHY CHEST WITH CONTRAST TECHNIQUE: Multidetector CT imaging of the chest was performed using the standard protocol during bolus administration of intravenous contrast. Multiplanar CT image reconstructions and MIPs were obtained to evaluate the vascular anatomy. CONTRAST:  78 mL Isovue 370 COMPARISON:  Chest radiograph 08/03/2017.  CT chest 11/22/2015 FINDINGS: Cardiovascular: Good opacification of the central and segmental pulmonary arteries. No focal filling defects. No evidence of significant pulmonary embolus. Cardiac enlargement. Reflux of contrast material into the hepatic veins suggesting passive congestion due to right heart failure. Coronary artery and aortic calcifications. No pericardial effusions. Normal caliber thoracic aorta. Mediastinum/Nodes: Bilateral hilar prominence may indicate hilar lymphadenopathy. Esophagus is decompressed. Lungs/Pleura: Diffuse patchy airspace disease scattered throughout both lungs with predominant perihilar  distribution. There is severe peribronchial thickening bilaterally. Multiple air bronchograms. Changes are most likely to represent multifocal bronchial pneumonia. Other considerations could include sarcoidosis, cryptogenic organizing pneumonia, or possibly atypical pneumonia such as TB. No pleural effusions. No pneumothorax. Focal narrowing of the bronchus intermedius, likely inflammatory. Focal areas of narrowing of more distal bronchi bilaterally, also likely inflammatory. Upper Abdomen: Gallbladder appears to be distended. No definite acute process demonstrated in the visualized upper abdomen. Musculoskeletal: Degenerative changes in the spine. No destructive bone lesions. Review of the MIP images confirms the above findings. IMPRESSION: 1. No evidence of significant pulmonary embolus. 2. Diffuse patchy airspace disease throughout the lungs with prominent peribronchial thickening causing  areas of bronchial narrowing. This likely represents multifocal bronchopneumonia. 3. Bilateral hilar lymphadenopathy, likely reactive. 4. Cardiac enlargement with evidence of right heart failure. Electronically Signed   By: Burman Nieves M.D.   On: 07/27/2017 03:55   Dg Chest Port 1 View  Result Date: 07/28/2017 CLINICAL DATA:  Respiratory failure EXAM: PORTABLE CHEST 1 VIEW COMPARISON:  July 26, 2017 FINDINGS: There is a skin fold over the upper right chest. No pneumothorax. Mild cardiomegaly. The hila and mediastinum are normal. Bilateral pulmonary opacities most prominent centrally have mildly worsened in the interval. No other interval changes. IMPRESSION: Mild worsening of the bilateral multifocal infiltrates, most prominent centrally. Electronically Signed   By: Gerome Sam III M.D   On: 07/28/2017 09:06   Dg Chest Port 1 View  Result Date: 07/26/2017 CLINICAL DATA:  Sepsis EXAM: PORTABLE CHEST 1 VIEW COMPARISON:  CT chest of 11/22/2015 and chest x-ray of the same day FINDINGS: There is patchy airspace  disease bilaterally right greater than left. No definite effusion is seen. These changes most likely reflect multifocal pneumonia with congestive heart failure less likely. The heart is within upper limits normal. No acute bony abnormality is seen. IMPRESSION: Patchy airspace disease bilaterally, right greater than left, most consistent with multifocal pneumonia. Electronically Signed   By: Dwyane Dee M.D.   On: 07/26/2017 17:08    Telemetry    Recurrent afib this am  converted to sinus after amio bolus - Personally Reviewed  Cardiac Studies   2D Echocardiogram 9.21.2018 Study Conclusions  - Left ventricle: Septal and posterior lateral hypokinesis. The   cavity size was mildly dilated. Wall thickness was normal.   Systolic function was mildly to moderately reduced. The estimated   ejection fraction was in the range of 40% to 45%. - Left atrium: The atrium was mildly dilated. - Atrial septum: A patent foramen ovale cannot be excluded.   Patient Profile     83 y.o. male with advanced Parkinsons, advanced dementia, dysphagia, admitted with asp pneumonia and sepsis, found to have + troponin and AFib RVR.  Assessment & Plan    1.  Multifocal PNA/Aspiration PNA/Sepsis:  Per IM.  Overall improving.  Completed abx.  Pending barium swallow.  2.  PAF:  Recurrent Afib this am.  Responded well to IV amio.  In setting of aspiration, will cont IV amio for now, at least until cleared for po meds, @ which point we can switch to PO amio.  Suspect likelihood of recurrent afib is high in setting of ongoing physiologic stress.  CHA2DS2VASc = 2-3 (Age, LV dysfxn).  Poor OAC candidate in setting of frailty/fall risk.  3.  Elevated troponin:  In setting of above.  Presumed demand ischemia.  EF 40-45% by echo. Coronary and Ao Ca2+ on CTA chest.  Poor candidate for cath/further ischemic eval.  Cont conservative rx.  4.  Cardiomyopathy:  Euvolemic.  Ef 40-45%.  No  blocker, acei/arb/arni/mra in setting of  soft bp/sepsis.  5.  AKI:  Creat stable.  Signed, Nicolasa Ducking, NP  07/28/2017, 9:52 AM     Attending note:  Patient seen and examined. Discussed case with Mr. Brion Aliment NP. I agree with his above assessment and management. Patient had recurrent rapid atrial fibrillation earlier this morning which converted to sinus rhythm after dose of IV amiodarone. In light of ongoing physiological stressors, agree with continuing amiodarone to help better suppress recurring atrial arrhythmias. Oral anticoagulation is not planned due to frailty and fall risk as well as  increased risk of bleeding. We will continue to follow with you.  Jonelle Sidle, M.D., F.A.C.C.

## 2017-07-28 NOTE — Progress Notes (Signed)
Noted pt in Afib/RVR on the monitor, 12 lead EKG obtained, Afib - 188 bpm. Pt complaints of chest thighness. Started on oxygen at 2lpm with some relief. Cards Jacques Navy, PA on the unit made aware when page to CCM failed. Received new orders

## 2017-07-28 NOTE — Progress Notes (Signed)
Modified Barium Swallow Progress Note  Patient Details  Name: Martin Lowe MRN: [id number] Date of Birth: 05-13-1934 09-1129; 1200-1240  Today's Date: 07/28/2017  Modified Barium Swallow completed.  Full report located under Chart Review in the Imaging Section.  Brief recommendations include the following:  Clinical Impression  Pt presents with a moderate oropharyngeal dysphagia.  There is reduced oral control of bolus material.  All consistencies reach the pyriform sinuses before pharyngeal swallow is initiated.  Thin liquids spill into the larynx and are aspirated before the swallow.  There is residue in the valleculae and pyriforms throughout the study, most of which does not clear, even with cued second swallows.  There is a tendency for pyriform residue to spill posteriorly over the arytenoids into the larynx.  Aspiration was generally trace, and occurred most commonly with thin liquids, but all materials are at risk to be aspirated given deficits described.  A chin tuck did not sufficiently protect the airway.     Returned to pt's room after the study and reviewed results, MBS video, and options for treatment at length and with multiple family members, including son, dtr, son-in-law and several grandchildren.  We discussed inherent aspiration risk given dementia and Parkinson's.  We discussed our ability to minimize but not prevent aspiration with diet/behavior modifications.  We discussed the contraindications of PEGs in pt's with advanced dementia.  [name], with son and daughter, decided to proceed with a modified diet - dysphagia 1/nectar thick liquids. SLP will follow for PO toleration/safety, and further education with family.  At some point in the near future, if not during this hospitalization, [name] may benefit from a consultation with Palliative Medicine.      Swallow Evaluation Recommendations       SLP Diet Recommendations: Nectar thick liquid;Dysphagia 1 (Puree)  solids   Liquid Administration via: Cup   Medication Administration: Crushed with puree       Compensations: Minimize environmental distractions;Slow rate;Small sips/bites   Postural Changes: Seated upright at 90 degrees   Oral Care Recommendations: Oral care BID   Other Recommendations: Order thickener from pharmacy    Blenda Mounts Laurice 07/28/2017,1:24 PM

## 2017-07-29 LAB — CBC
HCT: 34.5 % — ABNORMAL LOW (ref 39.0–52.0)
Hemoglobin: 11.2 g/dL — ABNORMAL LOW (ref 13.0–17.0)
MCH: 29.7 pg (ref 26.0–34.0)
MCHC: 32.5 g/dL (ref 30.0–36.0)
MCV: 91.5 fL (ref 78.0–100.0)
Platelets: 266 10*3/uL (ref 150–400)
RBC: 3.77 MIL/uL — ABNORMAL LOW (ref 4.22–5.81)
RDW: 14.1 % (ref 11.5–15.5)
WBC: 5.8 10*3/uL (ref 4.0–10.5)

## 2017-07-29 LAB — GLUCOSE, CAPILLARY
GLUCOSE-CAPILLARY: 103 mg/dL — AB (ref 65–99)
Glucose-Capillary: 110 mg/dL — ABNORMAL HIGH (ref 65–99)
Glucose-Capillary: 139 mg/dL — ABNORMAL HIGH (ref 65–99)

## 2017-07-29 MED ORDER — PIMAVANSERIN TARTRATE 34 MG PO CAPS: 34.0000 | ORAL_CAPSULE | Freq: Every morning | ORAL | Status: DC

## 2017-07-29 MED ORDER — CARBIDOPA-LEVODOPA 25-250 MG PO TABS
1.0000 | ORAL_TABLET | Freq: Every day | ORAL | Status: DC
  Administered 2017-07-29 – 2017-08-03 (×17): 1 via ORAL
  Filled 2017-07-29 (×29): qty 1

## 2017-07-29 MED ORDER — PIMAVANSERIN TARTRATE 17 MG PO TABS: 34.0000 mg | ORAL_TABLET | Freq: Every morning | ORAL | Status: DC

## 2017-07-29 MED ORDER — ALBUTEROL SULFATE (2.5 MG/3ML) 0.083% IN NEBU
2.5000 mg | INHALATION_SOLUTION | Freq: Four times a day (QID) | RESPIRATORY_TRACT | Status: DC
  Administered 2017-07-29 – 2017-08-02 (×16): 2.5 mg via RESPIRATORY_TRACT
  Filled 2017-07-29 (×16): qty 3

## 2017-07-29 MED ORDER — CARBIDOPA-LEVODOPA 25-100 MG PO TABS
0.5000 | ORAL_TABLET | Freq: Every day | ORAL | Status: DC
  Administered 2017-07-29 – 2017-08-02 (×3): 0.5 via ORAL
  Filled 2017-07-29 (×3): qty 1

## 2017-07-29 MED ORDER — ACETYLCYSTEINE 20 % IN SOLN
3.0000 mL | Freq: Four times a day (QID) | RESPIRATORY_TRACT | Status: DC
  Administered 2017-07-29 – 2017-07-30 (×3): 3 mL via RESPIRATORY_TRACT
  Administered 2017-07-30: 08:00:00 via RESPIRATORY_TRACT
  Administered 2017-07-30 – 2017-08-02 (×12): 3 mL via RESPIRATORY_TRACT
  Filled 2017-07-29 (×19): qty 4

## 2017-07-29 MED ORDER — TAMSULOSIN HCL 0.4 MG PO CAPS
0.4000 mg | ORAL_CAPSULE | Freq: Every day | ORAL | Status: DC
  Administered 2017-07-29: 0.4 mg via ORAL
  Filled 2017-07-29 (×3): qty 1

## 2017-07-29 NOTE — Progress Notes (Signed)
   Progress Note  Patient Name: Martin Lowe Date of Encounter: 07/29/2017  Primary Cardiologist: Dr. Kristeen Miss  No major change from cardiac perspective since yesterday. I personally reviewed telemetry which shows sinus rhythm, no breakthrough atrial fibrillation. Continues on IV amiodarone which will continue today and can likely be transitioned to oral load tomorrow. As noted previously, relatively poor candidate for anticoagulation. Pulmonary/Critical Care continues to manage other active issues.  Signed, Nona Dell, MD  07/29/2017, 10:38 AM

## 2017-07-29 NOTE — Progress Notes (Signed)
PROGRESS NOTE    Martin Lowe  UJW:[id number] DOB: [birth year] DOA: 07/26/2017 PCP: Patient, No Pcp Per   Brief Narrative 83 yo male with endstage parkinsons,dementia,frequentfalls,admitted with fever and cough,thought to be aspirating pneumonia.family by the bed side.patient sleeping as I walked into see him.he had rapid afib,hypoxic,chest xray multifocal pneumonia,head ct negative.he was started on vanco and cefepime.   Assessment & Plan:   Principal Problem:   HCAP (healthcare-associated pneumonia) Active Problems:   Dementia due to Parkinson's disease with behavioral disturbance (HCC)   Sepsis (HCC)   Troponin level elevated   PAF (paroxysmal atrial fibrillation) (HCC)   Atrial fibrillation with RVR (HCC)  Multi focal pneumonia-aspiration/hcap CONTINUE VANC and cefepime. Rapid afib rate controlled on amio. AKI follow up levels. LEUKOCYTOSIS improved HYPOTHYROIDSM continue synthroid. PARKINSONS DZ restart sinemet.   DVT prophylaxis noen Code Statusdnr Family Communication:dw SIL Disposition Plan: to nursing home [facility] once stable.   Consultant cards  Procedures:   Antimicrobials: vanco/cefepime  Subjective:opened eyes and looked at me,smiled.tried to follow commands.   Objective: Vitals:   07/28/17 2104 07/28/17 2339 07/29/17 0338 07/29/17 0742  BP: 121/80 126/89 123/80 (!) 121/93  Pulse: 67 62 74 64  Resp: Temp: (!) 97.5 F (36.4 C) 98.4 F (36.9 C) 97.8 F (36.6 C) (!) 97.3 F (36.3 C)  TempSrc: Oral Oral Oral Oral  SpO2: 97% 97% 95% 94%  Weight:   69.6 kg (153 lb 8 oz)   Height:        Intake/Output Summary (Last 24 hours) at 07/29/17 1434 Last data filed at 07/29/17 1100  Gross per 24 hour  Intake          2753.55 ml  Output              550 ml  Net          2203.55 ml   Filed Weights   07/27/17 0405 07/28/17 0427 07/29/17 0338  Weight: 68.1 kg (150 lb 2.1 oz) 67.6 kg (149 lb) 69.6 kg (153 lb 8 oz)    Examination:  General  exam: Appears calm and comfortable  Respiratory system: Clear to auscultation anteriorly. Respiratory effort normal. Cardiovascular system: S1 & S2 heard, RRR. No JVD, murmurs, rubs, gallops or clicks. No pedal edema. Gastrointestinal system: Abdomen is nondistended, soft and nontender. No organomegaly or masses felt. Normal bowel sounds heard. Central nervous system: Alert and oriented. No focal neurological deficits. Extremities: Symmetric 5 x 5 power. Skin: No rashes, lesions or ulcers Psychiatry: Judgement and insight appear normal. Mood & affect appropriate.     Data Reviewed: I have personally reviewed following labs and imaging studies  CBC:  Recent Labs Lab 07/26/17 1546 07/28/17 0635 07/29/17 0656  WBC 11.7* 10.0 5.8  NEUTROABS 9.7*  --   --   HGB 12.4* 11.5* 11.2*  HCT 38.5* 35.6* 34.5*  MCV 92.5 92.2 91.5  PLT 292 287 266   Basic Metabolic Panel:  Recent Labs Lab 07/26/17 1546 07/26/17 2247 07/27/17 0203 07/27/17 0403 07/28/17 0635  NA 135  --  136 136 140  K 4.6  --  4.4 4.3 3.7  CL 102  --  109 109 109  CO2 22  --  19* 20* 24  GLUCOSE 106*  --  140* 122* 82  BUN 38*  --  40* 38* 38*  CREATININE 1.70*  --  1.37* 1.31* 1.19  CALCIUM 8.7*  --  8.1* 8.1* 8.5*  MG  --  1.9  --  2.0  --   PHOS  --   --  3.0 2.8  --    GFR: Estimated Creatinine Clearance: 47.9 mL/min (by C-G formula based on SCr of 1.19 mg/dL). Liver Function Tests:  Recent Labs Lab 07/26/17 1546 07/27/17 0203  AST 84*  --   ALT 10*  --   ALKPHOS 87  --   BILITOT 1.9*  --   PROT 6.4*  --   ALBUMIN 3.2* 2.7*   No results for input(s): LIPASE, AMYLASE in the last 168 hours. No results for input(s): AMMONIA in the last 168 hours. Coagulation Profile:  Recent Labs Lab 07/26/17 1546  INR 1.35   Cardiac Enzymes:  Recent Labs Lab 07/26/17 2247 07/27/17 0403  TROPONINI 5.37* 6.02*   BNP (last 3 results) No results for input(s): PROBNP in the last 8760 hours. HbA1C: No  results for input(s): HGBA1C in the last 72 hours. CBG:  Recent Labs Lab 07/28/17 0739 07/28/17 1157 07/28/17 1627 07/29/17 0739  GLUCAP 85 114* 113* 103*   Lipid Profile: No results for input(s): CHOL, HDL, LDLCALC, TRIG, CHOLHDL, LDLDIRECT in the last 72 hours. Thyroid Function Tests:  Recent Labs  07/26/17 2247  TSH 2.067   Anemia Panel: No results for input(s): VITAMINB12, FOLATE, FERRITIN, TIBC, IRON, RETICCTPCT in the last 72 hours. Sepsis Labs:  Recent Labs Lab 07/26/17 1558 07/26/17 1922 07/26/17 2317 07/27/17 0208 07/27/17 0403  PROCALCITON  --   --   --   --  2.83  LATICACIDVEN 2.55* 1.47 2.3* 1.7  --     Recent Results (from the past 240 hour(s))  Culture, blood (Routine x 2)     Status: None (Preliminary result)   Collection Time: 07/26/17  4:05 PM  Result Value Ref Range Status   Specimen Description BLOOD LEFT FOREARM  Final   Special Requests   Final    BOTTLES DRAWN AEROBIC AND ANAEROBIC Blood Culture adequate volume   Culture NO GROWTH 2 DAYS  Final   Report Status PENDING  Incomplete  Culture, blood (Routine x 2)     Status: None (Preliminary result)   Collection Time: 07/26/17  4:09 PM  Result Value Ref Range Status   Specimen Description BLOOD RIGHT FOREARM  Final   Special Requests   Final    BOTTLES DRAWN AEROBIC AND ANAEROBIC Blood Culture results may not be optimal due to an excessive volume of blood received in culture bottles   Culture NO GROWTH 2 DAYS  Final   Report Status PENDING  Incomplete  MRSA PCR Screening     Status: None   Collection Time: 07/26/17 10:46 PM  Result Value Ref Range Status   MRSA by PCR NEGATIVE NEGATIVE Final    Comment:        The GeneXpert MRSA Assay (FDA approved for NASAL specimens only), is one component of a comprehensive MRSA colonization surveillance program. It is not intended to diagnose MRSA infection nor to guide or monitor treatment for MRSA infections.   Culture, Urine     Status:  Abnormal   Collection Time: 07/27/17  5:15 AM  Result Value Ref Range Status   Specimen Description URINE, CLEAN CATCH  Final   Special Requests NONE  Final   Culture <10,000 COLONIES/mL (A)  Final   Report Status 07/28/2017 FINAL  Final         Radiology Studies: Dg Chest Port 1 View  Result Date: 07/28/2017 CLINICAL DATA:  Respiratory failure EXAM: PORTABLE CHEST 1 VIEW COMPARISON:  July 26, 2017 FINDINGS: There is a skin fold over the upper right chest. No pneumothorax. Mild cardiomegaly. The hila and mediastinum are normal. Bilateral pulmonary opacities most prominent centrally have mildly worsened in the interval. No other interval changes. IMPRESSION: Mild worsening of the bilateral multifocal infiltrates, most prominent centrally. Electronically Signed   By: Gerome Sam III M.D   On: 07/28/2017 09:06   Dg Swallowing Func-speech Pathology  Result Date: 07/28/2017 Objective Swallowing Evaluation: Type of Study: MBS-Modified Barium Swallow Study Patient Details Name: Philippe Gang MRN: [id number] Date of Birth: 25-Feb-1934 Today's Date: 07/28/2017 Time: SLP Start Time (ACUTE ONLY): 1100-SLP Stop Time (ACUTE ONLY): 1130 SLP Time Calculation (min) (ACUTE ONLY): 30 min Past Medical History: Past Medical History: Diagnosis Date . Atrial fibrillation (HCC)  . BPH (benign prostatic hyperplasia)  . Dementia  . Failure to thrive in adult  . Frequent falls  . Hypothyroidism  . Kidney stone  . Parkinson disease (HCC)  . Urinary incontinence  Past Surgical History: Past Surgical History: Procedure Laterality Date . BLADDER SURGERY   . HERNIA REPAIR   . NEPHRECTOMY   . PROSTATE SURGERY   HPI: 83 y.o. male with hx of dementia and Parkinson's dz, FTT  admitted from [facility] with decreased activity, poor PO intake, difficulty swallowing, pna.  Subjective: alert, confused Assessment / Plan / Recommendation CHL IP CLINICAL IMPRESSIONS 07/28/2017 Clinical Impression Pt presents with a moderate  oropharyngeal dysphagia.  There is reduced oral control of bolus material.  All consistencies reach the pyriform sinuses before pharyngeal swallow is initiated.  Thin liquids spill into the larynx and are aspirated before the swallow.  There is residue in the valleculae and pyriforms throughout the study, most of which does not clear, even with cued second swallows.  There is a tendency for pyriform residue to spill posteriorly over the arytenoids into the larynx.  Aspiration was generally trace, and occurred most commonly with thin liquids, but all materials are at risk to be aspirated given deficits described.  A chin tuck did not sufficiently protect the airway.   Returned to pt's room after the study and reviewed results, MBS video, and options for treatment at length and with multiple family members, including son, dtr, son-in-law and several grandchildren.  We discussed inherent aspiration risk given dementia and Parkinson's.  We discussed our ability to minimize but not prevent aspiration with diet/behavior modifications.  We discussed the contraindications of PEGs in pt's with advanced dementia.  [name], with son and daughter, decided to proceed with a modified diet - dysphagia 1/nectar thick liquids. SLP will follow for PO toleration/safety, and further education with family.  At some point in the near future, if not during this hospitalization, Mrs. Sebring may benefit from a consultation with Palliative Medicine.   SLP Visit Diagnosis Dysphagia, oropharyngeal phase (R13.12) Attention and concentration deficit following -- Frontal lobe and executive function deficit following -- Impact on safety and function Moderate aspiration risk   CHL IP TREATMENT RECOMMENDATION 07/28/2017 Treatment Recommendations Therapy as outlined in treatment plan below   No flowsheet data found. CHL IP DIET RECOMMENDATION 07/28/2017 SLP Diet Recommendations Nectar thick liquid;Dysphagia 1 (Puree) solids Liquid Administration  via Cup Medication Administration Crushed with puree Compensations Minimize environmental distractions;Slow rate;Small sips/bites Postural Changes Seated upright at 90 degrees   CHL IP OTHER RECOMMENDATIONS 07/28/2017 Recommended Consults -- Oral Care Recommendations Oral care BID Other Recommendations Order thickener from pharmacy   CHL IP FOLLOW UP RECOMMENDATIONS 07/28/2017 Follow up Recommendations  Skilled Nursing facility   [facility] IP FREQUENCY AND DURATION 07/28/2017 Speech Therapy Frequency (ACUTE ONLY) min 3x week Treatment Duration 1 week      CHL IP ORAL PHASE 07/28/2017 Oral Phase Impaired Oral - Pudding Teaspoon -- Oral - Pudding Cup -- Oral - Honey Teaspoon -- Oral - Honey Cup -- Oral - Nectar Teaspoon -- Oral - Nectar Cup -- Oral - Nectar Straw Weak lingual manipulation;Lingual pumping;Decreased bolus cohesion Oral - Thin Teaspoon Impaired mastication;Weak lingual manipulation;Decreased bolus cohesion Oral - Thin Cup Impaired mastication;Weak lingual manipulation;Decreased bolus cohesion Oral - Thin Straw -- Oral - Puree Delayed oral transit;Weak lingual manipulation Oral - Mech Soft -- Oral - Regular -- Oral - Multi-Consistency -- Oral - Pill -- Oral Phase - Comment --  CHL IP PHARYNGEAL PHASE 07/28/2017 Pharyngeal Phase Impaired Pharyngeal- Pudding Teaspoon -- Pharyngeal -- Pharyngeal- Pudding Cup -- Pharyngeal -- Pharyngeal- Honey Teaspoon -- Pharyngeal -- Pharyngeal- Honey Cup -- Pharyngeal -- Pharyngeal- Nectar Teaspoon -- Pharyngeal -- Pharyngeal- Nectar Cup -- Pharyngeal -- Pharyngeal- Nectar Straw Reduced epiglottic inversion;Reduced anterior laryngeal mobility;Reduced laryngeal elevation;Reduced airway/laryngeal closure;Reduced tongue base retraction;Penetration/Aspiration before swallow;Pharyngeal residue - valleculae;Pharyngeal residue - pyriform;Compensatory strategies attempted (with notebox);Delayed swallow initiation-pyriform sinuses Pharyngeal Material enters airway, passes BELOW cords and not  ejected out despite cough attempt by patient Pharyngeal- Thin Teaspoon Reduced epiglottic inversion;Reduced anterior laryngeal mobility;Reduced laryngeal elevation;Reduced airway/laryngeal closure;Reduced tongue base retraction;Penetration/Aspiration before swallow;Pharyngeal residue - valleculae;Pharyngeal residue - pyriform;Compensatory strategies attempted (with notebox);Delayed swallow initiation-pyriform sinuses;Trace aspiration Pharyngeal Material enters airway, passes BELOW cords and not ejected out despite cough attempt by patient Pharyngeal- Thin Cup Reduced epiglottic inversion;Reduced anterior laryngeal mobility;Reduced laryngeal elevation;Reduced airway/laryngeal closure;Reduced tongue base retraction;Penetration/Aspiration before swallow;Pharyngeal residue - valleculae;Pharyngeal residue - pyriform;Compensatory strategies attempted (with notebox);Delayed swallow initiation-pyriform sinuses;Trace aspiration;Penetration/Apiration after swallow Pharyngeal Material enters airway, passes BELOW cords and not ejected out despite cough attempt by patient Pharyngeal- Thin Straw -- Pharyngeal -- Pharyngeal- Puree Reduced epiglottic inversion;Reduced anterior laryngeal mobility;Reduced laryngeal elevation;Reduced airway/laryngeal closure;Reduced tongue base retraction;Pharyngeal residue - valleculae;Pharyngeal residue - pyriform;Compensatory strategies attempted (with notebox);Delayed swallow initiation-pyriform sinuses Pharyngeal -- Pharyngeal- Mechanical Soft -- Pharyngeal -- Pharyngeal- Regular -- Pharyngeal -- Pharyngeal- Multi-consistency -- Pharyngeal -- Pharyngeal- Pill -- Pharyngeal -- Pharyngeal Comment --  No flowsheet data found. No flowsheet data found. Blenda Mounts Laurice 07/28/2017, 1:26 PM                   Scheduled Meds: . carbidopa-levodopa  0.5 tablet Oral Q2200  . carbidopa-levodopa  1 tablet Oral 5 X Daily  . chlorhexidine  15 mL Mouth Rinse BID  . heparin subcutaneous  5,000  Units Subcutaneous Q8H  . mouth rinse  15 mL Mouth Rinse q12n4p  . tamsulosin  0.4 mg Oral QHS   Continuous Infusions: . sodium chloride    . amiodarone 30 mg/hr (07/29/17 0556)  . ceFEPime (MAXIPIME) IV Stopped (07/28/17 1630)  . dextrose 5 % and 0.45% NaCl 75 mL/hr (07/29/17 0755)  . vancomycin Stopped (07/28/17 1731)     LOS: 3 days        Alwyn Ren, MD [facility]   If 7PM-7AM, please contact night-coverage [email or web address] Password [email or web address] 07/29/2017, 2:34 PM

## 2017-07-30 LAB — CBC
HCT: 35.5 % — ABNORMAL LOW (ref 39.0–52.0)
Hemoglobin: 11.4 g/dL — ABNORMAL LOW (ref 13.0–17.0)
MCH: 29.5 pg (ref 26.0–34.0)
MCHC: 32.1 g/dL (ref 30.0–36.0)
MCV: 91.7 fL (ref 78.0–100.0)
PLATELETS: 243 10*3/uL (ref 150–400)
RBC: 3.87 MIL/uL — ABNORMAL LOW (ref 4.22–5.81)
RDW: 14.3 % (ref 11.5–15.5)
WBC: 6 10*3/uL (ref 4.0–10.5)

## 2017-07-30 LAB — BASIC METABOLIC PANEL
ANION GAP: 6 (ref 5–15)
BUN: 17 mg/dL (ref 6–20)
CALCIUM: 8.3 mg/dL — AB (ref 8.9–10.3)
CO2: 23 mmol/L (ref 22–32)
CREATININE: 1 mg/dL (ref 0.61–1.24)
Chloride: 110 mmol/L (ref 101–111)
GLUCOSE: 119 mg/dL — AB (ref 65–99)
Potassium: 3.1 mmol/L — ABNORMAL LOW (ref 3.5–5.1)
Sodium: 139 mmol/L (ref 135–145)

## 2017-07-30 MED ORDER — PIPERACILLIN-TAZOBACTAM 3.375 G IVPB
3.3750 g | Freq: Three times a day (TID) | INTRAVENOUS | Status: DC
  Administered 2017-07-30 – 2017-08-03 (×11): 3.375 g via INTRAVENOUS
  Filled 2017-07-30 (×15): qty 50

## 2017-07-30 MED ORDER — PIPERACILLIN-TAZOBACTAM 3.375 G IVPB 30 MIN
3.3750 g | Freq: Once | INTRAVENOUS | Status: AC
  Administered 2017-07-30: 3.375 g via INTRAVENOUS
  Filled 2017-07-30: qty 50

## 2017-07-30 MED ORDER — POTASSIUM CHLORIDE IN NACL 40-0.9 MEQ/L-% IV SOLN
INTRAVENOUS | Status: AC
  Administered 2017-07-30 (×2): 100 mL/h via INTRAVENOUS
  Filled 2017-07-30 (×4): qty 1000

## 2017-07-30 NOTE — Progress Notes (Signed)
PROGRESS NOTE    Martin Lowe  UJW:[id number] DOB: [birth year] DOA: 07/26/2017 PCP: Patient, No Pcp Per    Brief Narrative: 83 year old male with end-stage Parkinson's disease, dementia, frequent falls, admitted with fever cough shortness of breath found to have aspiration pneumonia. Patient was being treated with vancomycin and cefepime. Repeat chest x-ray done does not show any improvement in his infiltrates. He has MRSA PCR was negative. Patient seen today with many family members at the bedside. I have answered all their questions. I have discussed with them about the possibility of this patient not getting better. They do not want a PEG tube placed for feeding. If patient does not get better on antibiotics  will consider palliative care.   Assessment & Plan:   Principal Problem:   HCAP (healthcare-associated pneumonia) Active Problems:   Dementia due to Parkinson's disease with behavioral disturbance (HCC)   Sepsis (HCC)   Troponin level elevated   PAF (paroxysmal atrial fibrillation) (HCC)   Atrial fibrillation with RVR (HCC)  Multi focal pneumonia-aspiration/hcap dc vanc and cefepime since mrsa negative and treating aspiration.will start zosyn. Rapid afib rate controlled on amio. AKI follow up levels. LEUKOCYTOSIS improved HYPOTHYROIDSM continue synthroid. PARKINSONS DZ restart sinemet.   Plan discussed with the family. Patient has not made any significant improvement. He has been sleeping all day and not having to eat anything by mouth. He has not been able to take his medications through his mouth. Patient's family is thinking about palliative care if patient doesn't make any progress.    DVT prophylaxis heparin Code Status:dnr Family Communication: dw family Disposition Plan: tbd   Consultants: cardiology  Procedures: none  Antimicrobials: zosyn   Subjective:  sleeping Objective: Vitals:   07/30/17 0757 07/30/17 0855 07/30/17 1300 07/30/17 1622  BP:   106/70 103/73   Pulse:      Resp:      Temp:  98.8 F (37.1 C) 98.1 F (36.7 C) 98.7 F (37.1 C)  TempSrc:  Oral Axillary Axillary  SpO2: 94% 92% 94% 94%  Weight:      Height:        Intake/Output Summary (Last 24 hours) at 07/30/17 1718 Last data filed at 07/30/17 0336  Gross per 24 hour  Intake                0 ml  Output              300 ml  Net             -300 ml   Filed Weights   07/28/17 0427 07/29/17 0338 07/30/17 0331  Weight: 67.6 kg (149 lb) 69.6 kg (153 lb 8 oz) 70.1 kg (154 lb 8 oz)    Examination:  General exam: Appears calm and comfortable  Respiratory system: rhonchi auscultation. Respiratory effort normal. Cardiovascular system: S1 & S2 heard, RRR. No JVD, murmurs, rubs, gallops or clicks. No pedal edema. Gastrointestinal system: Abdomen is nondistended, soft and nontender. No organomegaly or masses felt. Normal bowel sounds heard. Central nervous system: Alert and oriented. No focal neurological deficits. Extremities: Symmetric 5 x 5 power. Skin: No rashes, lesions or ulcers Psychiatry: Judgement and insight appear normal. Mood & affect appropriate.     Data Reviewed: I have personally reviewed following labs and imaging studies  CBC:  Recent Labs Lab 07/26/17 1546 07/28/17 0635 07/29/17 0656 07/30/17 0306  WBC 11.7* 10.0 5.8 6.0  NEUTROABS 9.7*  --   --   --   HGB 12.4*  11.5* 11.2* 11.4*  HCT 38.5* 35.6* 34.5* 35.5*  MCV 92.5 92.2 91.5 91.7  PLT 292 287 266 243   Basic Metabolic Panel:  Recent Labs Lab 07/26/17 1546 07/26/17 2247 07/27/17 0203 07/27/17 0403 07/28/17 0635 07/30/17 0306  NA 135  --  136 136 140 139  K 4.6  --  4.4 4.3 3.7 3.1*  CL 102  --  109 109 109 110  CO2 22  --  19* 20* 24 23  GLUCOSE 106*  --  140* 122* 82 119*  BUN 38*  --  40* 38* 38* 17  CREATININE 1.70*  --  1.37* 1.31* 1.19 1.00  CALCIUM 8.7*  --  8.1* 8.1* 8.5* 8.3*  MG  --  1.9  --  2.0  --   --   PHOS  --   --  3.0 2.8  --   --     GFR: Estimated Creatinine Clearance: 57.4 mL/min (by C-G formula based on SCr of 1 mg/dL). Liver Function Tests:  Recent Labs Lab 07/26/17 1546 07/27/17 0203  AST 84*  --   ALT 10*  --   ALKPHOS 87  --   BILITOT 1.9*  --   PROT 6.4*  --   ALBUMIN 3.2* 2.7*   No results for input(s): LIPASE, AMYLASE in the last 168 hours. No results for input(s): AMMONIA in the last 168 hours. Coagulation Profile:  Recent Labs Lab 07/26/17 1546  INR 1.35   Cardiac Enzymes:  Recent Labs Lab 07/26/17 2247 07/27/17 0403  TROPONINI 5.37* 6.02*   BNP (last 3 results) No results for input(s): PROBNP in the last 8760 hours. HbA1C: No results for input(s): HGBA1C in the last 72 hours. CBG:  Recent Labs Lab 07/28/17 1157 07/28/17 1627 07/29/17 0739 07/29/17 1612 07/29/17 2208  GLUCAP 114* 113* 103* 110* 139*   Lipid Profile: No results for input(s): CHOL, HDL, LDLCALC, TRIG, CHOLHDL, LDLDIRECT in the last 72 hours. Thyroid Function Tests: No results for input(s): TSH, T4TOTAL, FREET4, T3FREE, THYROIDAB in the last 72 hours. Anemia Panel: No results for input(s): VITAMINB12, FOLATE, FERRITIN, TIBC, IRON, RETICCTPCT in the last 72 hours. Sepsis Labs:  Recent Labs Lab 07/26/17 1558 07/26/17 1922 07/26/17 2317 07/27/17 0208 07/27/17 0403  PROCALCITON  --   --   --   --  2.83  LATICACIDVEN 2.55* 1.47 2.3* 1.7  --     Recent Results (from the past 240 hour(s))  Culture, blood (Routine x 2)     Status: None (Preliminary result)   Collection Time: 07/26/17  4:05 PM  Result Value Ref Range Status   Specimen Description BLOOD LEFT FOREARM  Final   Special Requests   Final    BOTTLES DRAWN AEROBIC AND ANAEROBIC Blood Culture adequate volume   Culture NO GROWTH 4 DAYS  Final   Report Status PENDING  Incomplete  Culture, blood (Routine x 2)     Status: None (Preliminary result)   Collection Time: 07/26/17  4:09 PM  Result Value Ref Range Status   Specimen Description BLOOD  RIGHT FOREARM  Final   Special Requests   Final    BOTTLES DRAWN AEROBIC AND ANAEROBIC Blood Culture results may not be optimal due to an excessive volume of blood received in culture bottles   Culture NO GROWTH 4 DAYS  Final   Report Status PENDING  Incomplete  MRSA PCR Screening     Status: None   Collection Time: 07/26/17 10:46 PM  Result Value Ref Range  Status   MRSA by PCR NEGATIVE NEGATIVE Final    Comment:        The GeneXpert MRSA Assay (FDA approved for NASAL specimens only), is one component of a comprehensive MRSA colonization surveillance program. It is not intended to diagnose MRSA infection nor to guide or monitor treatment for MRSA infections.   Culture, Urine     Status: Abnormal   Collection Time: 07/27/17  5:15 AM  Result Value Ref Range Status   Specimen Description URINE, CLEAN CATCH  Final   Special Requests NONE  Final   Culture <10,000 COLONIES/mL (A)  Final   Report Status 07/28/2017 FINAL  Final         Radiology Studies: No results found.      Scheduled Meds: . acetylcysteine  3 mL Nebulization Q6H  . albuterol  2.5 mg Nebulization Q6H  . carbidopa-levodopa  0.5 tablet Oral Q2200  . carbidopa-levodopa  1 tablet Oral 5 X Daily  . chlorhexidine  15 mL Mouth Rinse BID  . heparin subcutaneous  5,000 Units Subcutaneous Q8H  . mouth rinse  15 mL Mouth Rinse q12n4p  . tamsulosin  0.4 mg Oral QHS   Continuous Infusions: . sodium chloride    . 0.9 % NaCl with KCl 40 mEq / L 100 mL/hr (07/30/17 1309)  . amiodarone 30 mg/hr (07/30/17 0727)  . dextrose 5 % and 0.45% NaCl 75 mL/hr (07/29/17 0755)  . piperacillin-tazobactam (ZOSYN)  IV       LOS: 4 days     Alwyn Ren, MD [facility]  If 7PM-7AM, please contact night-coverage [email or web address] Password [email or web address] 07/30/2017, 5:18 PM

## 2017-07-30 NOTE — Progress Notes (Signed)
Progress Note  Patient Name: Martin Lowe Date of Encounter: 07/30/2017  Primary Cardiologist:   New (Dr. Elease Hashimoto)  Subjective   Somnolent.  Arouses but not able to answer questions.    Inpatient Medications    Scheduled Meds: . acetylcysteine  3 mL Nebulization Q6H  . albuterol  2.5 mg Nebulization Q6H  . carbidopa-levodopa  0.5 tablet Oral Q2200  . carbidopa-levodopa  1 tablet Oral 5 X Daily  . chlorhexidine  15 mL Mouth Rinse BID  . heparin subcutaneous  5,000 Units Subcutaneous Q8H  . mouth rinse  15 mL Mouth Rinse q12n4p  . tamsulosin  0.4 mg Oral QHS   Continuous Infusions: . sodium chloride    . amiodarone 30 mg/hr (07/30/17 0727)  . ceFEPime (MAXIPIME) IV Stopped (07/29/17 1708)  . dextrose 5 % and 0.45% NaCl 75 mL/hr (07/29/17 0755)  . vancomycin Stopped (07/29/17 1947)   PRN Meds: sodium chloride   Vital Signs    Vitals:   07/30/17 0331 07/30/17 0700 07/30/17 0757 07/30/17 0855  BP: 131/77   106/70  Pulse: 74 66    Resp: (!) 32 (!) 27    Temp: 99.7 F (37.6 C)   98.8 F (37.1 C)  TempSrc: Axillary   Oral  SpO2: 93% 94% 94% 92%  Weight: 154 lb 8 oz (70.1 kg)     Height:        Intake/Output Summary (Last 24 hours) at 07/30/17 0912 Last data filed at 07/30/17 0336  Gross per 24 hour  Intake           382.08 ml  Output             1000 ml  Net          -617.92 ml   Filed Weights   07/28/17 0427 07/29/17 0338 07/30/17 0331  Weight: 149 lb (67.6 kg) 153 lb 8 oz (69.6 kg) 154 lb 8 oz (70.1 kg)    Telemetry    NSR, converted from atrial fib - Personally Reviewed  ECG    NA - Personally Reviewed  Physical Exam   GEN: No acute distress.   Neck: No  JVD Cardiac: RRR, no murmurs, rubs, or gallops.  Respiratory: Clear  to auscultation bilaterally. GI: Soft, nontender, non-distended  MS: No  edema; No deformity. Neuro:  Nonfocal  Psych:   Somnolent  Labs    Chemistry Recent Labs Lab 07/26/17 1546 07/27/17 0203 07/27/17 0403  07/28/17 0635 07/30/17 0306  NA 135 136 136 140 139  K 4.6 4.4 4.3 3.7 3.1*  CL 102 109 109 109 110  CO2 22 19* 20* 24 23  GLUCOSE 106* 140* 122* 82 119*  BUN 38* 40* 38* 38* 17  CREATININE 1.70* 1.37* 1.31* 1.19 1.00  CALCIUM 8.7* 8.1* 8.1* 8.5* 8.3*  PROT 6.4*  --   --   --   --   ALBUMIN 3.2* 2.7*  --   --   --   AST 84*  --   --   --   --   ALT 10*  --   --   --   --   ALKPHOS 87  --   --   --   --   BILITOT 1.9*  --   --   --   --   GFRNONAA 36* 47* 49* 55* >60  GFRAA 42* 54* 57* >60 >60  ANIONGAP Hematology Recent Labs Lab 07/28/17  4540 07/29/17 0656 07/30/17 0306  WBC 10.0 5.8 6.0  RBC 3.86* 3.77* 3.87*  HGB 11.5* 11.2* 11.4*  HCT 35.6* 34.5* 35.5*  MCV 92.2 91.5 91.7  MCH 29.8 29.7 29.5  MCHC 32.3 32.5 32.1  RDW 14.2 14.1 14.3  PLT 287 266 243    Cardiac Enzymes Recent Labs Lab 07/26/17 2247 07/27/17 0403  TROPONINI 5.37* 6.02*    Recent Labs Lab 07/26/17 1558  TROPIPOC 3.12*     BNP Recent Labs Lab 07/26/17 1546  BNP 939.5*     DDimer  Recent Labs Lab 07/26/17 2247  DDIMER 2.12*     Radiology    Dg Swallowing Func-speech Pathology  Result Date: 07/28/2017 Objective Swallowing Evaluation: Type of Study: MBS-Modified Barium Swallow Study Patient Details Name: Martin Lowe MRN: [id number] Date of Birth: 05/07/1934 Today's Date: 07/28/2017 Time: SLP Start Time (ACUTE ONLY): 1100-SLP Stop Time (ACUTE ONLY): 1130 SLP Time Calculation (min) (ACUTE ONLY): 30 min Past Medical History: Past Medical History: Diagnosis Date . Atrial fibrillation (HCC)  . BPH (benign prostatic hyperplasia)  . Dementia  . Failure to thrive in adult  . Frequent falls  . Hypothyroidism  . Kidney stone  . Parkinson disease (HCC)  . Urinary incontinence  Past Surgical History: Past Surgical History: Procedure Laterality Date . BLADDER SURGERY   . HERNIA REPAIR   . NEPHRECTOMY   . PROSTATE SURGERY   HPI: 83 y.o. male with hx of dementia and Parkinson's dz, FTT   admitted from [facility] with decreased activity, poor PO intake, difficulty swallowing, pna.  Subjective: alert, confused Assessment / Plan / Recommendation CHL IP CLINICAL IMPRESSIONS 07/28/2017 Clinical Impression Pt presents with a moderate oropharyngeal dysphagia.  There is reduced oral control of bolus material.  All consistencies reach the pyriform sinuses before pharyngeal swallow is initiated.  Thin liquids spill into the larynx and are aspirated before the swallow.  There is residue in the valleculae and pyriforms throughout the study, most of which does not clear, even with cued second swallows.  There is a tendency for pyriform residue to spill posteriorly over the arytenoids into the larynx.  Aspiration was generally trace, and occurred most commonly with thin liquids, but all materials are at risk to be aspirated given deficits described.  A chin tuck did not sufficiently protect the airway.   Returned to pt's room after the study and reviewed results, MBS video, and options for treatment at length and with multiple family members, including son, dtr, son-in-law and several grandchildren.  We discussed inherent aspiration risk given dementia and Parkinson's.  We discussed our ability to minimize but not prevent aspiration with diet/behavior modifications.  We discussed the contraindications of PEGs in pt's with advanced dementia.  [name], with son and daughter, decided to proceed with a modified diet - dysphagia 1/nectar thick liquids. SLP will follow for PO toleration/safety, and further education with family.  At some point in the near future, if not during this hospitalization, Mrs. Orlich may benefit from a consultation with Palliative Medicine.   SLP Visit Diagnosis Dysphagia, oropharyngeal phase (R13.12) Attention and concentration deficit following -- Frontal lobe and executive function deficit following -- Impact on safety and function Moderate aspiration risk   CHL IP TREATMENT  RECOMMENDATION 07/28/2017 Treatment Recommendations Therapy as outlined in treatment plan below   No flowsheet data found. CHL IP DIET RECOMMENDATION 07/28/2017 SLP Diet Recommendations Nectar thick liquid;Dysphagia 1 (Puree) solids Liquid Administration via Cup Medication Administration Crushed with puree Compensations Minimize  environmental distractions;Slow rate;Small sips/bites Postural Changes Seated upright at 90 degrees   CHL IP OTHER RECOMMENDATIONS 07/28/2017 Recommended Consults -- Oral Care Recommendations Oral care BID Other Recommendations Order thickener from pharmacy   CHL IP FOLLOW UP RECOMMENDATIONS 07/28/2017 Follow up Recommendations Skilled Nursing facility   [facility] IP FREQUENCY AND DURATION 07/28/2017 Speech Therapy Frequency (ACUTE ONLY) min 3x week Treatment Duration 1 week      CHL IP ORAL PHASE 07/28/2017 Oral Phase Impaired Oral - Pudding Teaspoon -- Oral - Pudding Cup -- Oral - Honey Teaspoon -- Oral - Honey Cup -- Oral - Nectar Teaspoon -- Oral - Nectar Cup -- Oral - Nectar Straw Weak lingual manipulation;Lingual pumping;Decreased bolus cohesion Oral - Thin Teaspoon Impaired mastication;Weak lingual manipulation;Decreased bolus cohesion Oral - Thin Cup Impaired mastication;Weak lingual manipulation;Decreased bolus cohesion Oral - Thin Straw -- Oral - Puree Delayed oral transit;Weak lingual manipulation Oral - Mech Soft -- Oral - Regular -- Oral - Multi-Consistency -- Oral - Pill -- Oral Phase - Comment --  CHL IP PHARYNGEAL PHASE 07/28/2017 Pharyngeal Phase Impaired Pharyngeal- Pudding Teaspoon -- Pharyngeal -- Pharyngeal- Pudding Cup -- Pharyngeal -- Pharyngeal- Honey Teaspoon -- Pharyngeal -- Pharyngeal- Honey Cup -- Pharyngeal -- Pharyngeal- Nectar Teaspoon -- Pharyngeal -- Pharyngeal- Nectar Cup -- Pharyngeal -- Pharyngeal- Nectar Straw Reduced epiglottic inversion;Reduced anterior laryngeal mobility;Reduced laryngeal elevation;Reduced airway/laryngeal closure;Reduced tongue base  retraction;Penetration/Aspiration before swallow;Pharyngeal residue - valleculae;Pharyngeal residue - pyriform;Compensatory strategies attempted (with notebox);Delayed swallow initiation-pyriform sinuses Pharyngeal Material enters airway, passes BELOW cords and not ejected out despite cough attempt by patient Pharyngeal- Thin Teaspoon Reduced epiglottic inversion;Reduced anterior laryngeal mobility;Reduced laryngeal elevation;Reduced airway/laryngeal closure;Reduced tongue base retraction;Penetration/Aspiration before swallow;Pharyngeal residue - valleculae;Pharyngeal residue - pyriform;Compensatory strategies attempted (with notebox);Delayed swallow initiation-pyriform sinuses;Trace aspiration Pharyngeal Material enters airway, passes BELOW cords and not ejected out despite cough attempt by patient Pharyngeal- Thin Cup Reduced epiglottic inversion;Reduced anterior laryngeal mobility;Reduced laryngeal elevation;Reduced airway/laryngeal closure;Reduced tongue base retraction;Penetration/Aspiration before swallow;Pharyngeal residue - valleculae;Pharyngeal residue - pyriform;Compensatory strategies attempted (with notebox);Delayed swallow initiation-pyriform sinuses;Trace aspiration;Penetration/Apiration after swallow Pharyngeal Material enters airway, passes BELOW cords and not ejected out despite cough attempt by patient Pharyngeal- Thin Straw -- Pharyngeal -- Pharyngeal- Puree Reduced epiglottic inversion;Reduced anterior laryngeal mobility;Reduced laryngeal elevation;Reduced airway/laryngeal closure;Reduced tongue base retraction;Pharyngeal residue - valleculae;Pharyngeal residue - pyriform;Compensatory strategies attempted (with notebox);Delayed swallow initiation-pyriform sinuses Pharyngeal -- Pharyngeal- Mechanical Soft -- Pharyngeal -- Pharyngeal- Regular -- Pharyngeal -- Pharyngeal- Multi-consistency -- Pharyngeal -- Pharyngeal- Pill -- Pharyngeal -- Pharyngeal Comment --  No flowsheet data found. No flowsheet  data found. Blenda Mounts Laurice 07/28/2017, 1:26 PM               Cardiac Studies   Echo:  - Left ventricle: Septal and posterior lateral hypokinesis. The   cavity size was mildly dilated. Wall thickness was normal.   Systolic function was mildly to moderately reduced. The estimated   ejection fraction was in the range of 40% to 45%. - Left atrium: The atrium was mildly dilated. - Atrial septum: A patent foramen ovale cannot be excluded.   Patient Profile     83 y.o. male with a hx of Parkinson's disease, BPH, dementia, failure to thrive, and frequent falls residing in hospice home who is being seen for the evaluation of atrial fib and elevated troponin at the request of Dr. Donnald Garre.  Assessment & Plan    ATRIAL FIB:   Not a candidate for oral anticoagulation.  He is, this morning, unable to take POs. So we will  leave him on his IV amiodarone.  Change to PO when able.    NSTEMI    Medical management.     EF is slightly low but BP would not allow med titration.       Signed, Rollene Rotunda, MD  07/30/2017, 9:12 AM

## 2017-07-30 NOTE — Progress Notes (Addendum)
Pharmacy Antibiotic Note  Martin Lowe is a 83 y.o. male with concern of aspiration PNA on vancomycin and cefepime (day 4 therapy). Spoke with Dr. Ashley Royalty: Will discontinue Zosyn to cover anaerobes and discontinue vancomycin  -WBC= WNL, afeb, SCr= 1.1, CrCl ~ 60, cultures, ngtd, MRSA PCR- negative  Plan: -Zosyn 3.375gmIV q8h -discontinue vancomycin -Will follow renal function, cultures and clinical progress  Height:  (177.8 cm) Weight: 154 lb 8 oz (70.1 kg) IBW/kg (Calculated) : 73  Temp (24hrs), Avg:98.1 F (36.7 C), Min:97.4 F (36.3 C), Max:99.7 F (37.6 C)   Recent Labs Lab 07/26/17 1546 07/26/17 1558 07/26/17 1922 07/26/17 2317 07/27/17 0203 07/27/17 0208 07/27/17 0403 07/28/17 0635 07/29/17 0656 07/30/17 0306  WBC 11.7*  --   --   --   --   --   --  10.0 5.8 6.0  CREATININE 1.70*  --   --   --  1.37*  --  1.31* 1.19  --  1.00  LATICACIDVEN  --  2.55* 1.47 2.3*  --  1.7  --   --   --   --     Estimated Creatinine Clearance: 57.4 mL/min (by C-G formula based on SCr of 1 mg/dL).    Allergies  Allergen Reactions  . Ciprofloxacin Hives and Rash  . Nitrofurantoin Rash    Antimicrobials this admission: 9/20 vancomycin > 9/24 9/20 cefepime > 9/24 9/24 zosyn>>  Dose adjustments this admission:   Microbiology results: 9/20 blood cx: ngtd 9/21 urine- < 10 K 9/20 MRSA PCR- neg  Harland German, Pharm D 07/30/2017 8:28 AM

## 2017-07-30 NOTE — Care Management Important Message (Signed)
Important Message  Patient Details  Name: Martin Lowe MRN: [id number] Date of Birth: Oct 04, 1934   Medicare Important Message Given:  Yes    Kyla Balzarine 07/30/2017, 10:44 AM

## 2017-07-31 ENCOUNTER — Inpatient Hospital Stay (HOSPITAL_COMMUNITY): Payer: Medicare Other

## 2017-07-31 DIAGNOSIS — Z66 Do not resuscitate: Secondary | ICD-10-CM

## 2017-07-31 DIAGNOSIS — Z515 Encounter for palliative care: Secondary | ICD-10-CM

## 2017-07-31 DIAGNOSIS — N179 Acute kidney failure, unspecified: Secondary | ICD-10-CM

## 2017-07-31 DIAGNOSIS — T17908S Unspecified foreign body in respiratory tract, part unspecified causing other injury, sequela: Secondary | ICD-10-CM

## 2017-07-31 DIAGNOSIS — L8990 Pressure ulcer of unspecified site, unspecified stage: Secondary | ICD-10-CM | POA: Insufficient documentation

## 2017-07-31 LAB — CBC
HCT: 35.6 % — ABNORMAL LOW (ref 39.0–52.0)
Hemoglobin: 11.2 g/dL — ABNORMAL LOW (ref 13.0–17.0)
MCH: 29.1 pg (ref 26.0–34.0)
MCHC: 31.5 g/dL (ref 30.0–36.0)
MCV: 92.5 fL (ref 78.0–100.0)
PLATELETS: 223 10*3/uL (ref 150–400)
RBC: 3.85 MIL/uL — AB (ref 4.22–5.81)
RDW: 14.6 % (ref 11.5–15.5)
WBC: 6.1 10*3/uL (ref 4.0–10.5)

## 2017-07-31 LAB — BASIC METABOLIC PANEL
ANION GAP: 8 (ref 5–15)
BUN: 14 mg/dL (ref 6–20)
CO2: 19 mmol/L — ABNORMAL LOW (ref 22–32)
Calcium: 8.3 mg/dL — ABNORMAL LOW (ref 8.9–10.3)
Chloride: 114 mmol/L — ABNORMAL HIGH (ref 101–111)
Creatinine, Ser: 1.04 mg/dL (ref 0.61–1.24)
GFR calc Af Amer: >60 mL/min (ref >60)
Glucose, Bld: 94 mg/dL (ref 65–99)
POTASSIUM: 4 mmol/L (ref 3.5–5.1)
SODIUM: 141 mmol/L (ref 135–145)

## 2017-07-31 LAB — CULTURE, BLOOD (ROUTINE X 2)
Culture: NO GROWTH
Culture: NO GROWTH
SPECIAL REQUESTS: ADEQUATE

## 2017-07-31 LAB — RNA QUALITATIVE

## 2017-07-31 LAB — HIV ANTIBODY (ROUTINE TESTING W REFLEX): HIV Screen 4th Generation wRfx: REACTIVE — AB

## 2017-07-31 LAB — HIV 1/2 AB DIFFERENTIATION
HIV 1 AB: NEGATIVE
HIV 2 Ab: NEGATIVE
NOTE (HIV CONF MULTISPOT): NEGATIVE

## 2017-07-31 MED ORDER — ALBUTEROL SULFATE (2.5 MG/3ML) 0.083% IN NEBU: 2.5000 mg | INHALATION_SOLUTION | RESPIRATORY_TRACT | Status: DC | PRN

## 2017-07-31 NOTE — Progress Notes (Signed)
PROGRESS NOTE    Martin Lowe  ZOX:[id number] DOB: [birth year] DOA: 07/26/2017 PCP: Patient, No Pcp Per    Brief Narrative: 83 year old male with end-stage Parkinson's disease, dementia, frequent falls, admitted with fever cough shortness of breath found to have aspiration pneumonia. Patient was being treated with vancomycin and cefepime. Repeat chest x-ray done does not show any improvement in his infiltrates. He has MRSA PCR was negative. Patient seen today with many family members at the bedside. I have answered all their questions. I have discussed with them about the possibility of this patient not getting better. They do not want a PEG tube placed for feeding. Patient improved but still high risk for aspiration. Family has agreed to palliative care due to agreed upon comfort feeding.   Assessment & Plan:   Principal Problem:   HCAP (healthcare-associated pneumonia) Active Problems:   Dementia due to Parkinson's disease with behavioral disturbance (HCC)   Sepsis (HCC)   Troponin level elevated   PAF (paroxysmal atrial fibrillation) (HCC)   Atrial fibrillation with RVR (HCC)   Pressure injury of skin  Multi focal pneumonia-aspiration/hcap dc vanc and cefepime since mrsa negative and treating aspiration. Started zosyn (9/24). High risk for aspiration, family has agreed to comfort feeding, palliative care to see patient and educate family and axis with disposition Rapid afib rate controlled on amio. AKI, resolved follow up levels. HYPOTHYROIDSM continue synthroid. PARKINSONS DZ, cont sinemet.   Plan discussed with the family. Patient had significant improvement. He is now alert and eating.    DVT prophylaxis heparin Code Status:dnr Family Communication: dw family Disposition Plan: tbd   Consultants: cardiology  Procedures: none  Antimicrobials: zosyn   Subjective:  sleeping Objective: Vitals:   07/30/17 1622 07/30/17 2024 07/31/17 0337 07/31/17 0454  BP:    (!)  110/58  Pulse:  61  92  Resp:  17  (!) 24  Temp: 98.7 F (37.1 C)  (!) 97.4 F (36.3 C) 97.7 F (36.5 C)  TempSrc: Axillary  Axillary Oral  SpO2: 94%   98%  Weight:    67.9 kg (149 lb 9.6 oz)  Height:        Intake/Output Summary (Last 24 hours) at 07/31/17 0910 Last data filed at 07/31/17 0507  Gross per 24 hour  Intake          2450.02 ml  Output              600 ml  Net          1850.02 ml   Filed Weights   07/29/17 0338 07/30/17 0331 07/31/17 0454  Weight: 69.6 kg (153 lb 8 oz) 70.1 kg (154 lb 8 oz) 67.9 kg (149 lb 9.6 oz)    Examination:  General exam: Appears calm and comfortable. A&Ox1 Respiratory system: rhonchi auscultation & rales. Respiratory effort normal. Cardiovascular system: S1 & S2 heard, RRR. No JVD, murmurs, rubs, gallops or clicks. No pedal edema. Gastrointestinal system: Abdomen is nondistended, soft and nontender. No organomegaly or masses felt. Normal bowel sounds heard. Central nervous system: No focal neurological deficits. Extremities: Moving all extremities Skin: No rashes, lesions or ulcers Psychiatry:. Mood & affect appropriate.     Data Reviewed: I have personally reviewed following labs and imaging studies  CBC:  Recent Labs Lab 07/26/17 1546 07/28/17 0635 07/29/17 0656 07/30/17 0306 07/31/17 0559  WBC 11.7* 10.0 5.8 6.0 6.1  NEUTROABS 9.7*  --   --   --   --   HGB 12.4* 11.5* 11.2*  11.4* 11.2*  HCT 38.5* 35.6* 34.5* 35.5* 35.6*  MCV 92.5 92.2 91.5 91.7 92.5  PLT 292 287 266 243 223   Basic Metabolic Panel:  Recent Labs Lab 07/26/17 1546 07/26/17 2247 07/27/17 0203 07/27/17 0403 07/28/17 0635 07/30/17 0306  NA 135  --  136 136 140 139  K 4.6  --  4.4 4.3 3.7 3.1*  CL 102  --  109 109 109 110  CO2 22  --  19* 20* 24 23  GLUCOSE 106*  --  140* 122* 82 119*  BUN 38*  --  40* 38* 38* 17  CREATININE 1.70*  --  1.37* 1.31* 1.19 1.00  CALCIUM 8.7*  --  8.1* 8.1* 8.5* 8.3*  MG  --  1.9  --  2.0  --   --   PHOS  --   --   3.0 2.8  --   --    GFR: Estimated Creatinine Clearance: 55.6 mL/min (by C-G formula based on SCr of 1 mg/dL). Liver Function Tests:  Recent Labs Lab 07/26/17 1546 07/27/17 0203  AST 84*  --   ALT 10*  --   ALKPHOS 87  --   BILITOT 1.9*  --   PROT 6.4*  --   ALBUMIN 3.2* 2.7*   No results for input(s): LIPASE, AMYLASE in the last 168 hours. No results for input(s): AMMONIA in the last 168 hours. Coagulation Profile:  Recent Labs Lab 07/26/17 1546  INR 1.35   Cardiac Enzymes:  Recent Labs Lab 07/26/17 2247 07/27/17 0403  TROPONINI 5.37* 6.02*   BNP (last 3 results) No results for input(s): PROBNP in the last 8760 hours. HbA1C: No results for input(s): HGBA1C in the last 72 hours. CBG:  Recent Labs Lab 07/28/17 1157 07/28/17 1627 07/29/17 0739 07/29/17 1612 07/29/17 2208  GLUCAP 114* 113* 103* 110* 139*   Lipid Profile: No results for input(s): CHOL, HDL, LDLCALC, TRIG, CHOLHDL, LDLDIRECT in the last 72 hours. Thyroid Function Tests: No results for input(s): TSH, T4TOTAL, FREET4, T3FREE, THYROIDAB in the last 72 hours. Anemia Panel: No results for input(s): VITAMINB12, FOLATE, FERRITIN, TIBC, IRON, RETICCTPCT in the last 72 hours. Sepsis Labs:  Recent Labs Lab 07/26/17 1558 07/26/17 1922 07/26/17 2317 07/27/17 0208 07/27/17 0403  PROCALCITON  --   --   --   --  2.83  LATICACIDVEN 2.55* 1.47 2.3* 1.7  --     Recent Results (from the past 240 hour(s))  Culture, blood (Routine x 2)     Status: None (Preliminary result)   Collection Time: 07/26/17  4:05 PM  Result Value Ref Range Status   Specimen Description BLOOD LEFT FOREARM  Final   Special Requests   Final    BOTTLES DRAWN AEROBIC AND ANAEROBIC Blood Culture adequate volume   Culture NO GROWTH 4 DAYS  Final   Report Status PENDING  Incomplete  Culture, blood (Routine x 2)     Status: None (Preliminary result)   Collection Time: 07/26/17  4:09 PM  Result Value Ref Range Status   Specimen  Description BLOOD RIGHT FOREARM  Final   Special Requests   Final    BOTTLES DRAWN AEROBIC AND ANAEROBIC Blood Culture results may not be optimal due to an excessive volume of blood received in culture bottles   Culture NO GROWTH 4 DAYS  Final   Report Status PENDING  Incomplete  MRSA PCR Screening     Status: None   Collection Time: 07/26/17 10:46 PM  Result Value  Ref Range Status   MRSA by PCR NEGATIVE NEGATIVE Final    Comment:        The GeneXpert MRSA Assay (FDA approved for NASAL specimens only), is one component of a comprehensive MRSA colonization surveillance program. It is not intended to diagnose MRSA infection nor to guide or monitor treatment for MRSA infections.   Culture, Urine     Status: Abnormal   Collection Time: 07/27/17  5:15 AM  Result Value Ref Range Status   Specimen Description URINE, CLEAN CATCH  Final   Special Requests NONE  Final   Culture <10,000 COLONIES/mL (A)  Final   Report Status 07/28/2017 FINAL  Final         Radiology Studies: Dg Chest 1 View  Result Date: 07/31/2017 CLINICAL DATA:  Fever and cough for the past 4 days. Recently diagnosed with pneumonia. Hypoxia this morning. EXAM: CHEST 1 VIEW COMPARISON:  Portable chest x-ray of July 28, 2017 FINDINGS: The lungs are reasonably well inflated. Bilateral interstitial and alveolar opacities persist but are slightly less conspicuous today. The retrocardiac region remains dense. There is no pneumothorax or large pleural effusion. The heart is normal in size. The pulmonary vascularity is not clearly engorged. There is calcification in the wall of the aortic arch. IMPRESSION: Bilateral interstitial and alveolar opacities most compatible with pneumonia. There has been slight interval improvement since the study of 2 days ago. Thoracic aortic atherosclerosis. Electronically Signed   By: David  [place] M.D.   On: 07/31/2017 07:50        Scheduled Meds: . acetylcysteine  3 mL Nebulization Q6H   . albuterol  2.5 mg Nebulization Q6H  . carbidopa-levodopa  0.5 tablet Oral Q2200  . carbidopa-levodopa  1 tablet Oral 5 X Daily  . chlorhexidine  15 mL Mouth Rinse BID  . heparin subcutaneous  5,000 Units Subcutaneous Q8H  . mouth rinse  15 mL Mouth Rinse q12n4p  . tamsulosin  0.4 mg Oral QHS   Continuous Infusions: . sodium chloride    . 0.9 % NaCl with KCl 40 mEq / L 100 mL/hr (07/30/17 2336)  . amiodarone 30 mg/hr (07/30/17 2130)  . dextrose 5 % and 0.45% NaCl 75 mL/hr (07/29/17 0755)  . piperacillin-tazobactam (ZOSYN)  IV 3.375 g (07/31/17 0319)     LOS: 5 days     Haydee Salter, MD [facility]  If 7PM-7AM, please contact night-coverage [email or web address] Password [email or web address] 07/31/2017, 9:10 AM

## 2017-07-31 NOTE — Progress Notes (Signed)
  Speech Language Pathology Treatment: Dysphagia  Patient Details Name: Martin Lowe MRN: [id number] DOB: 1934/03/02 Today's Date: 07/31/2017 Time: 0045-9977 SLP Time Calculation (min) (ACUTE ONLY): 14 min  Assessment / Plan / Recommendation Clinical Impression  Visited pt at am meal with sons and son in law present and feeding the pt with total assist. Pt noted to have persistent coughing with purees and nectar thick liquids, indicating high probability of ongoing aspiration events despite modified diet, consistent with MBS finding of risk with all textures. Sons report pt has disliked thickened liquids and they have offered coke and water for pts comfort. SLP discussed progressive nature of dysphagia, probability of ongoing aspiration events and possibility of dehydration with modified diet. We reviewed risk and benefit of comfort feeding, meaning eliminating diet restrictions to increase pts enjoyment of PO with ongoing risk of aspiration.   Described the importance of oral care, including brushing with a tooth brush and removing dentures at least three times a day to reduce oral bacteria.  Pts family favors comfort course of care and agrees to returning to a dys 3 finely chopped diet with thin liquids allowing for pt to have outside foods of choice. MD also present for discussion. Will sign off as education is complete.   HPI        SLP Plan  All goals met       Recommendations  Diet recommendations: Dysphagia 2 (fine chop);Thin liquid Liquids provided via: Cup Medication Administration: Crushed with puree Supervision: Staff to assist with self feeding;Full supervision/cueing for compensatory strategies;Trained caregiver to feed patient Compensations: Slow rate;Small sips/bites Postural Changes and/or Swallow Maneuvers: Seated upright 90 degrees                Oral Care Recommendations: Oral care QID Follow up Recommendations: Skilled Nursing facility SLP Visit Diagnosis:  Dysphagia, oropharyngeal phase (R13.12) Plan: All goals met       GO               Herbie Baltimore, MA CCC-SLP [phone]  Lynann Beaver 07/31/2017, 9:31 AM

## 2017-07-31 NOTE — Progress Notes (Signed)
    I reviewed the chart and spoke with nursing.  The patient is unable to take POs.  A decision should be made today about what to do with his oral meds as it would be prudent to either stop his amiodarone if he is comfort care or to make a decision to use a feeding tube for meds and then change to VT amiodarone 400 mg bid.  Please call with any questions once this conversation is completed.  Currently NSR on tele.

## 2017-07-31 NOTE — Consult Note (Signed)
Consultation Note Date: 07/31/2017   Patient Name: Martin Lowe  DOB: 29-Dec-1933  MRN: [id number]  Age / Sex: 83 y.o., male  PCP: Patient, No Pcp Per Referring Physician: Haydee Salter, MD  Reason for Consultation: Establishing goals of care and Psychosocial/spiritual support  HPI/Patient Profile: 83 y.o. male   admitted on 07/26/2017 with  past medical history of Parkinson's disease, BPH, dementia, hypothyroidism, failure to thrive, urinary incontinence, HOH, and frequent falls who presented to [facility] ER with complaints of fever and cough.   Patient is a resident at [facility] since March 2018.   He is normally able to communicate but is confused and uses a wheelchair for mobilization but often attempts to walk causing him to have multiple falls.  His family reports increased dysphagia and oral intake since Tuesday and today with increased lethargy.  Noted to have a 102.7 fever and CXR performed at SNF reported a right pneumonia.  He was sent to the ER for further treatment.  In the ER, patient was found to be in rapid Afib w/ RVR and initially normotensive, oxygen sats 83% on RA, and febrile.  CXR noted for multifocal pneumonia right worse than left.  Head CT negative for acute process.  Labs significant for WBC 11.7, LA 2.55 -> 1.48, sCr 1.7, +UTI, BNP 939, and troponin 3.12 with EKG showing diffuse ST depression and Rapid Afib.  He was treated with 3L NS bolus, vancomycin and cefepime.  Cardiology consulted.  He spontaneously converted to NSR in the 70's but progressively became more hypotensive requiring low-dose vasopressor.     Per family continued physical, functional and cognitive decline.  Poor progression in spite of medical interventions.  Family face treatment option decisions, advanced directive decision, anticipatory care needs.   Clinical Assessment and Goals of Care:   This NP Lorinda Creed  reviewed medical records, received report from team, assessed the patient and then meet at the patient's bedside along with wife, son , daughter  to discuss diagnosis, prognosis, GOC, EOL wishes disposition and options.  A detailed discussion was had today regarding advanced directives.  Concepts specific to code status, artifical feeding and hydration, continued IV antibiotics and rehospitalization was had.  The difference between a aggressive medical intervention path  and a palliative comfort care path for this patient at this time was had.  Values and goals of care important to patient and family were attempted to be elicited.  MOST form introduced and Hard Choices left for review  Concept of Hospice and Palliative Care were discussed  Natural trajectory and expectations at EOL were discussed.  Questions and concerns addressed.   Family encouraged to call with questions or concerns.  PMT will continue to support holistically.    HCPOA- wife, she tells me they have an AD, encouraged to review and bring to hospital for scanning.  Decision is for no artifical feeding    SUMMARY OF RECOMMENDATIONS    Code Status/Advance Care Planning:  DNR  Symptom Management:   Dysphagia: Recommended diet as  tolerated with high risk for aspiration  Palliative Prophylaxis:   Aspiration, Bowel Regimen, Delirium Protocol, Frequent Pain Assessment and Oral Care  Additional Recommendations (Limitations, Scope, Preferences):  Full Scope Treatment- hopeful for retrun to baseline  Psycho-social/Spiritual:   Desire for further Chaplaincy support:no  Additional Recommendations: Education on Hospice  Prognosis:   < 6 months- may be less depending on decision for life prolonging measures  Discharge Planning: To Be Determined      Primary Diagnoses: Present on Admission: . Sepsis (HCC) . (Resolved) Pneumonia . Dementia due to Parkinson's disease with behavioral disturbance (HCC) . HCAP  (healthcare-associated pneumonia) . Troponin level elevated   I have reviewed the medical record, interviewed the patient and family, and examined the patient. The following aspects are pertinent.  Past Medical History:  Diagnosis Date  . Atrial fibrillation (HCC)   . BPH (benign prostatic hyperplasia)   . Dementia   . Failure to thrive in adult   . Frequent falls   . Hypothyroidism   . Kidney stone   . Parkinson disease (HCC)   . Urinary incontinence    Social History   Social History  . Marital status: Married    Spouse name: N/A  . Number of children: N/A  . Years of education: N/A   Social History Main Topics  . Smoking status: Never Smoker  . Smokeless tobacco: Never Used  . Alcohol use No  . Drug use: No  . Sexual activity: Not Asked   Other Topics Concern  . None   Social History Narrative  . None   Family History  Problem Relation Age of Onset  . Alzheimer's disease Mother   . COPD Father    Scheduled Meds: . acetylcysteine  3 mL Nebulization Q6H  . albuterol  2.5 mg Nebulization Q6H  . carbidopa-levodopa  0.5 tablet Oral Q2200  . carbidopa-levodopa  1 tablet Oral 5 X Daily  . chlorhexidine  15 mL Mouth Rinse BID  . heparin subcutaneous  5,000 Units Subcutaneous Q8H  . mouth rinse  15 mL Mouth Rinse q12n4p  . tamsulosin  0.4 mg Oral QHS   Continuous Infusions: . sodium chloride    . amiodarone 30 mg/hr (07/30/17 2130)  . dextrose 5 % and 0.45% NaCl 75 mL/hr (07/29/17 0755)  . piperacillin-tazobactam (ZOSYN)  IV Stopped (07/31/17 1324)   PRN Meds:.sodium chloride, albuterol Medications Prior to Admission:  Prior to Admission medications   Medication Sig Start Date End Date Taking? Authorizing Provider  acetaminophen (TYLENOL) 325 MG tablet Take 650 mg by mouth every 4 (four) hours as needed for mild pain.   Yes [provider]  carbidopa-levodopa (SINEMET IR) 25-100 MG tablet Take 0.5 tablets by mouth See admin instructions. At 10 pm    Yes [provider]  carbidopa-levodopa (SINEMET IR) 25-250 MG tablet Take 1 tablet by mouth See admin instructions. at 8 am, 11 am, 2 pm, 5 pm and 8 pm.   Yes [provider]  cholecalciferol (VITAMIN D) 1000 units tablet Take 2,000 Units by mouth daily.    Yes [provider]  magnesium hydroxide (MILK OF MAGNESIA) 400 MG/5ML suspension Take 30 mLs by mouth daily as needed for mild constipation.   Yes [provider]  Melatonin 3 MG TABS Take 3 mg by mouth at bedtime.   Yes [provider]  Pimavanserin Tartrate (NUPLAZID) 17 MG TABS Take 34 mg by mouth every morning.    Yes [provider]  polyethylene glycol (MIRALAX /  GLYCOLAX) packet Take 17 g by mouth daily.   Yes [provider]  Psyllium (METAMUCIL FIBER PO) Take 1 scoop by mouth daily.   Yes [provider]  senna-docusate (SENOKOT-S) 8.6-50 MG tablet Take 2 tablets by mouth 2 (two) times daily.   Yes [provider]  solifenacin (VESICARE) 10 MG tablet Take 10 mg by mouth at bedtime.    Yes [provider]  tamsulosin (FLOMAX) 0.4 MG CAPS capsule Take 0.4 mg by mouth at bedtime.   Yes [provider]  traZODone (DESYREL) 50 MG tablet Take 25 mg by mouth at bedtime.   Yes [provider]  UNABLE TO FIND Med Name: Mighty shake by mouth daily   Yes [provider]  UNABLE TO FIND Place 1 drop into both ears as needed (for insomnia/agitation/restlessness/anxiety). Med Name: lavender oil  Behind ears   Yes [provider]   Allergies  Allergen Reactions  . Ciprofloxacin Hives and Rash  . Nitrofurantoin Rash   Review of Systems  Unable to perform ROS: Dementia    Physical Exam  Constitutional: He appears cachectic. He appears ill.  HENT:  Audible throat secretions  Neurological:  - confused and disoriented   Skin: Skin is warm and dry.    Vital Signs: BP (!) 119/59 (BP Location: Right Leg)   Pulse 70    Temp 99.4 F (37.4 C) (Axillary)   Resp 16   Ht  (1.778 m)   Wt 67.9 kg (149 lb 9.6 oz)   SpO2 93%   BMI 21.47 kg/m  Pain Assessment: 0-10   Pain Score: 6    SpO2: SpO2: 93 % O2 Device:SpO2: 93 % O2 Flow Rate: .O2 Flow Rate (L/min): 2 L/min  IO: Intake/output summary:  Intake/Output Summary (Last 24 hours) at 07/31/17 1632 Last data filed at 07/31/17 0507  Gross per 24 hour  Intake          2450.02 ml  Output              600 ml  Net          1850.02 ml    LBM: Last BM Date: 07/27/17 Baseline Weight: Weight: 70.8 kg (156 lb) Most recent weight: Weight: 67.9 kg (149 lb 9.6 oz)     Palliative Assessment/Data: 30 % at best     Time In: 1545 Time Out: 1700 Time Total: 75 min Greater than 50%  of this time was spent counseling and coordinating care related to the above assessment and plan.  Signed by: Lorinda Creed, NP   Please contact Palliative Medicine Team phone at [phone] for questions and concerns.  For individual provider: See Loretha Stapler

## 2017-07-31 NOTE — Care Management Note (Addendum)
Case Management Note  Patient Details  Name: Martin Lowe MRN: [id number] Date of Birth: 07/19/1934  Subjective/Objective: Pt presented from [facility]- treating for HCAP- Continues on IV Zosyn. Afib RVR- IV Amio gtt. Palliative Care following.                  Action/Plan: CSW following for SNF- CM will continue to monitor.   Expected Discharge Date:                  Expected Discharge Plan:  Skilled Nursing Facility  In-House Referral:  Clinical Social Work  Discharge planning Services  CM Consult  Post Acute Care Choice:  NA Choice offered to:  NA  DME Arranged:  N/A DME Agency:  NA  HH Arranged:  NA HH Agency:  NA  Status of Service:  Completed, signed off  If discussed at [employer] of Stay Meetings, dates discussed:  07-31-17, 08-02-17  Additional Comments: 08-04-17 1113 Tomi Bamberger, RN,BSN [phone] Plan [facility] to [facility]. CSW assisting with disposition needs. No further needs from CM at this time.    1553 08-03-17 Tomi Bamberger, RN,BSN [phone] Family Meeting with Palliative this evening- in discussion agreeable to Residential Hospice. CSW is aware. CM will continue to monitor.  Gala Lewandowsky, RN 07/31/2017, 3:53 PM

## 2017-07-31 NOTE — [place] FL2 (Signed)
[place] MEDICAID FL2 LEVEL OF CARE SCREENING TOOL     IDENTIFICATION  Patient Name: Martin Lowe Birthdate: [birth year] Sex: male Admission Date (Current Location): 07/26/2017  [facility] and [place] Number:  Producer, television/film/video and Address:  [facility] [place], [address] [place], [place] [address]      Provider Number: [id number]  Attending Physician Name and Address:  Haydee Salter, MD  Relative Name and Phone Number:  Drayk Humbarger, spouse, [phone]    Current Level of Care: Hospital Recommended Level of Care: Skilled Nursing Facility Prior Approval Number:    Date Approved/Denied:   PASRR Number: [id number]  Discharge Plan: SNF    Current Diagnoses: Patient Active Problem List   Diagnosis Date Noted  . Pressure injury of skin 07/31/2017  . PAF (paroxysmal atrial fibrillation) (HCC)   . Atrial fibrillation with RVR (HCC)   . Sepsis (HCC) 07/26/2017  . HCAP (healthcare-associated pneumonia)   . Troponin level elevated   . Dementia due to Parkinson's disease with behavioral disturbance (HCC) 02/07/2017  . Major depression, chronic 02/07/2017  . Severe protein-calorie malnutrition (HCC) 02/07/2017  . Chronic constipation 02/07/2017  . Insomnia 02/07/2017  . Benign prostatic hyperplasia with urinary frequency 02/07/2017  . HYPERLIPIDEMIA 11/27/2006  . Parkinson's disease (HCC) 11/27/2006    Orientation RESPIRATION BLADDER Height & Weight     Self, Place  Normal Incontinent, External catheter Weight: 149 lb 9.6 oz (67.9 kg) Height:   (177.8 cm)  BEHAVIORAL SYMPTOMS/MOOD NEUROLOGICAL BOWEL NUTRITION STATUS      Incontinent Diet (please see DC summary)  AMBULATORY STATUS COMMUNICATION OF NEEDS Skin    (baseline) Verbally PU Stage and Appropriate Care (R heel, no dressing)                       Personal Care Assistance Level of Assistance   (baseline)           Functional Limitations Info  Sight, Hearing, Speech  Sight Info: Adequate Hearing Info: Impaired Speech Info: Adequate    SPECIAL CARE FACTORS FREQUENCY                       Contractures Contractures Info: Not present    Additional Factors Info  Code Status, Allergies Code Status Info: DNR Allergies Info: ciprofloxacin, nitrofurantoin           Current Medications (07/31/2017):  This is the current hospital active medication list Current Facility-Administered Medications  Medication Dose Route Frequency Provider Last Rate Last Dose  . 0.9 %  sodium chloride infusion  250 mL Intravenous PRN Selmer Dominion B, NP      . acetylcysteine (MUCOMYST) 20 % nebulizer / oral solution 3 mL  3 mL Nebulization Q6H Alwyn Ren, MD   3 mL at 07/31/17 0904  . albuterol (PROVENTIL) (2.5 MG/3ML) 0.083% nebulizer solution 2.5 mg  2.5 mg Nebulization Q6H Alwyn Ren, MD   2.5 mg at 07/31/17 0904  . albuterol (PROVENTIL) (2.5 MG/3ML) 0.083% nebulizer solution 2.5 mg  2.5 mg Nebulization Q2H PRN Haydee Salter, MD      . amiodarone (NEXTERONE PREMIX) 360-4.14 MG/200ML-% (1.8 mg/mL) IV infusion  30 mg/hr Intravenous Continuous Ok Anis, NP 16.7 mL/hr at 07/30/17 2130 30 mg/hr at 07/30/17 2130  . carbidopa-levodopa (SINEMET IR) 25-100 MG per tablet immediate release 0.5 tablet  0.5 tablet Oral Q2200 Alwyn Ren, MD   0.5 tablet at 07/29/17 2201  .  carbidopa-levodopa (SINEMET IR) 25-250 MG per tablet immediate release 1 tablet  1 tablet Oral 5 X Daily Alwyn Ren, MD   1 tablet at 07/29/17 2211  . chlorhexidine (PERIDEX) 0.12 % solution 15 mL  15 mL Mouth Rinse BID Nelda Bucks, MD   15 mL at 07/30/17 2120  . dextrose 5 %-0.45 % sodium chloride infusion   Intravenous Continuous Nyoka Cowden, MD 75 mL/hr at 07/29/17 0755 75 mL/hr at 07/29/17 0755  . heparin injection 5,000 Units  5,000 Units Subcutaneous Q8H Nelda Bucks, MD   5,000 Units at 07/31/17 0645  . MEDLINE mouth rinse  15 mL  Mouth Rinse q12n4p Nelda Bucks, MD   15 mL at 07/30/17 1600  . piperacillin-tazobactam (ZOSYN) IVPB 3.375 g  3.375 g Intravenous Q8H Silvana Newness, RPH 12.5 mL/hr at 07/31/17 0924 3.375 g at 07/31/17 0924  . tamsulosin (FLOMAX) capsule 0.4 mg  0.4 mg Oral QHS Alwyn Ren, MD   0.4 mg at 07/29/17 2200     Discharge Medications: Please see discharge summary for a list of discharge medications.  Relevant Imaging Results:  Relevant Lab Results:   Additional Information SSN: [id number]  Abigail Butts, LCSW

## 2017-07-31 NOTE — Clinical Social Work Note (Signed)
Clinical Social Work Assessment  Patient Details  Name: Martin Lowe MRN: [id number] Date of Birth: 1934-03-13  Date of referral:  07/31/17               Reason for consult:                   Permission sought to share information with:  Facility Sport and exercise psychologist, Family Supports Permission granted to share information::  Yes, Verbal Permission Granted  Name::     Canden Cieslinski  Agency::  [facility]  Relationship::  spouse  Contact Information:  [phone]  Housing/Transportation Living arrangements for the past 2 months:  [place] of Information:  Adult Children, Facility Patient Interpreter Needed:  None Criminal Activity/Legal Involvement Pertinent to Current Situation/Hospitalization:  No - Comment as needed Significant Relationships:  Adult Children, Spouse Lives with:  Facility Resident Do you feel safe going back to the place where you live?  Yes Need for family participation in patient care:  Yes (Comment)  Care giving concerns: Patient from [facility]. Spouse and several adult children at bedside. Palliative consult pending.   Social Worker assessment / plan: CSW met with patient and family at bedside. Patient lethargic; did respond to daughter holding his hand and speaking to him. Patient's family in agreement that patient should return to [facility] at discharge, likely with palliative following, but family awaiting palliative recommendations. CSW updated [facility] admissions that patient will likely return. CSW to follow and support with disposition planning.  Employment status:  Retired Forensic scientist:  [employer], [employer] [facility]) PT Recommendations:  Not assessed at this time Information / Referral to community resources:  [place]  Patient/Family's Response to care: Family appreciative of patient's care.  Patient/Family's Understanding of and Emotional Response to Diagnosis, Current Treatment, and Prognosis:  Family awaiting palliative recommendations. Family with realistic expectations for possible prognosis and care. Family in agreement to return to [facility] with palliative, unless another recommendation made.   Emotional Assessment Appearance:  Appears stated age Attitude/Demeanor/Rapport:  Unable to Assess Affect (typically observed):  Unable to Assess Orientation:  Oriented to Self, Oriented to Place Alcohol / Substance use:  Not Applicable Psych involvement (Current and /or in the community):  No (Comment)  Discharge Needs  Concerns to be addressed:  Discharge Planning Concerns Readmission within the last 30 days:  No Current discharge risk:  Physical Impairment Barriers to Discharge:  Continued Medical Work up   Estanislado Emms, LCSW 07/31/2017, 10:41 AM

## 2017-08-01 DIAGNOSIS — R1310 Dysphagia, unspecified: Secondary | ICD-10-CM

## 2017-08-01 DIAGNOSIS — Z66 Do not resuscitate: Secondary | ICD-10-CM

## 2017-08-01 DIAGNOSIS — Z515 Encounter for palliative care: Secondary | ICD-10-CM

## 2017-08-01 LAB — BASIC METABOLIC PANEL
ANION GAP: 9 (ref 5–15)
BUN: 10 mg/dL (ref 6–20)
CALCIUM: 8.2 mg/dL — AB (ref 8.9–10.3)
CO2: 23 mmol/L (ref 22–32)
Chloride: 110 mmol/L (ref 101–111)
Creatinine, Ser: 1.03 mg/dL (ref 0.61–1.24)
GFR calc Af Amer: >60 mL/min (ref >60)
GLUCOSE: 123 mg/dL — AB (ref 65–99)
Potassium: 3.6 mmol/L (ref 3.5–5.1)
SODIUM: 142 mmol/L (ref 135–145)

## 2017-08-01 LAB — CBC
HCT: 35.2 % — ABNORMAL LOW (ref 39.0–52.0)
Hemoglobin: 11.4 g/dL — ABNORMAL LOW (ref 13.0–17.0)
MCH: 30 pg (ref 26.0–34.0)
MCHC: 32.4 g/dL (ref 30.0–36.0)
MCV: 92.6 fL (ref 78.0–100.0)
PLATELETS: 246 10*3/uL (ref 150–400)
RBC: 3.8 MIL/uL — ABNORMAL LOW (ref 4.22–5.81)
RDW: 15.2 % (ref 11.5–15.5)
WBC: 6 10*3/uL (ref 4.0–10.5)

## 2017-08-01 MED ORDER — ALBUTEROL SULFATE (2.5 MG/3ML) 0.083% IN NEBU: 2.5000 mg | INHALATION_SOLUTION | RESPIRATORY_TRACT | Status: DC | PRN

## 2017-08-01 MED ORDER — AMIODARONE HCL 200 MG PO TABS
400.0000 mg | ORAL_TABLET | Freq: Two times a day (BID) | ORAL | Status: DC
  Administered 2017-08-01 – 2017-08-02 (×4): 400 mg via ORAL
  Filled 2017-08-01 (×4): qty 2

## 2017-08-01 NOTE — Progress Notes (Signed)
Patient ID: Martin Lowe, male   DOB: August 14, 1934, 83 y.o.   MRN: [id number]  This NP visited patient at the bedside as a follow up to  yesterday's GOCs meeting.    Patient is alert but confused to person place and time.  Family attempting to feed patient; audible throat secretions and cough.  Return to bedside this afternoon at family request for continued discussion regarding diagnosis, prognosis, goals of care, end of life wishes, disposition and options.  Wife, son, daughter and her husband are present.  A detailed discussion was had related to the concept of overall failure to thrive secondary to the diagnosis of Parkinson's disease.  We discussed concepts specific to the MOST form intently on artificial feeding and hydration and continued antibiotic use.  Discussed natural trajectory and expectations at end of life.  Family clearly speak to no artificial feeding now or in the future.  However they cannot make a definitive decision today to shift to a full comfort path.  They tell me they need "more time" to make decisions.   Patient has had hospice services in the past so family is familiar with hospice benefit.  Discussed with family that if a full comfort path was the approach to care at this time Martin Lowe would likely be eligible for residential hospice.  I shared with family that I would be present in the hospital through Monday and gave them the contact information and phone number for the palliative medicine team, encouraged them to call with questions or concerns.  Discussed with family the importance of continued conversation with family members and their  medical providers regarding overall plan of care and treatment options,  ensuring decisions are within the context of the patients values and GOCs.  Encouraged them to review  Documented advanced directives and bring a copy to the hospital to be scanned into EMR.  Questions and concerns addressed  discussed with Dr. Melynda Ripple  Time  in   1630        Time out   1715  total time spent on the unit was 45 minutes   Greater than 50% of the time was spent in counseling and coordination of care  Lorinda Creed NP  Palliative Medicine Team Team Phone # [phone] Pager [phone]

## 2017-08-01 NOTE — Progress Notes (Signed)
PROGRESS NOTE    Martin Lowe  ZOX:[id number] DOB: [birth year] DOA: 07/26/2017 PCP: Patient, No Pcp Per    Brief Narrative: 83 year old male with end-stage Parkinson's disease, dementia, frequent falls, admitted with fever cough shortness of breath found to have aspiration pneumonia. Patient was being treated with vancomycin and cefepime. Repeat chest x-ray done does not show any improvement in his infiltrates. He has MRSA PCR was negative. Patient seen today with many family members at the bedside. I have answered all their questions. I have discussed with them about the possibility of this patient not getting better. They do not want a PEG tube placed for feeding. Patient improved but still high risk for aspiration. Family has agreed to palliative care due to agreed upon comfort feeding.   Assessment & Plan:   Principal Problem:   HCAP (healthcare-associated pneumonia) Active Problems:   Dementia due to Parkinson's disease with behavioral disturbance (HCC)   Sepsis (HCC)   Troponin level elevated   PAF (paroxysmal atrial fibrillation) (HCC)   Atrial fibrillation with RVR (HCC)   Pressure injury of skin   Palliative care by specialist   DNR (do not resuscitate)   Dysphagia  Multi focal pneumonia-aspiration/hcap dc vanc and cefepime since mrsa negative and treating aspiration. Started zosyn (9/24). Will likely proceed to comfort care tomorrow. High risk for aspiration, family has agreed to comfort feeding, palliative care to see patient and educate family and axis with disposition. Reemphasized education given previously about patient's aspiration risk and recurrence of aspiration pneumonia. They voiced understanding but claimed fast to hope. Advised that discharge to hospice house is strongly advised. Rapid afib rate controlled on amio. AKI, resolved follow up levels. HYPOTHYROIDSM continue synthroid. PARKINSONS DZ, cont sinemet.   Plan discussed with the family. Patient had  significant improvement. He is now alert and eating.    DVT prophylaxis heparin Code Status:dnr Family Communication: dw family Disposition Plan: tbd   Consultants: cardiology  Procedures: none  Antimicrobials: zosyn   Subjective: Spoke to wife and daughter. Concerned about patient's long-term well-being. Want to know if Zosyn will cure him.  Objective: Vitals:   08/01/17 0054 08/01/17 0726 08/01/17 0819 08/01/17 1453  BP:      Pulse:      Resp:      Temp:   97.8 F (36.6 C)   TempSrc:   Axillary   SpO2: 94% 100%  95%  Weight:      Height:        Intake/Output Summary (Last 24 hours) at 08/01/17 1728 Last data filed at 08/01/17 0300  Gross per 24 hour  Intake           925.45 ml  Output                0 ml  Net           925.45 ml   Filed Weights   07/29/17 0338 07/30/17 0331 07/31/17 0454  Weight: 69.6 kg (153 lb 8 oz) 70.1 kg (154 lb 8 oz) 67.9 kg (149 lb 9.6 oz)    Examination:  General exam: Appears calm and comfortable. A&Ox1 Respiratory system: rhonchi auscultation & rales. Respiratory effort normal. Cardiovascular system: S1 & S2 heard, RRR. No JVD, murmurs, rubs, gallops or clicks. No pedal edema. Gastrointestinal system: Abdomen is nondistended, soft and nontender. No organomegaly or masses felt. Normal bowel sounds heard. Central nervous system: No focal neurological deficits. Extremities: Moving all extremities Skin: No rashes, lesions or ulcers Psychiatry:. Mood & affect  appropriate.     Data Reviewed: I have personally reviewed following labs and imaging studies  CBC:  Recent Labs Lab 07/26/17 1546 07/28/17 0635 07/29/17 0656 07/30/17 0306 07/31/17 0559 08/01/17 0530  WBC 11.7* 10.0 5.8 6.0 6.1 6.0  NEUTROABS 9.7*  --   --   --   --   --   HGB 12.4* 11.5* 11.2* 11.4* 11.2* 11.4*  HCT 38.5* 35.6* 34.5* 35.5* 35.6* 35.2*  MCV 92.5 92.2 91.5 91.7 92.5 92.6  PLT 292 287 266 243 223 246   Basic Metabolic Panel:  Recent Labs Lab  07/26/17 2247 07/27/17 0203 07/27/17 0403 07/28/17 0635 07/30/17 0306 07/31/17 0900 08/01/17 0530  NA  --  136 136 140 139 141 142  K  --  4.4 4.3 3.7 3.1* 4.0 3.6  CL  --  109 109 109 110 114* 110  CO2  --  19* 20* 24 23 19* 23  GLUCOSE  --  140* 122* 82 119* 94 123*  BUN  --  40* 38* 38* CREATININE  --  1.37* 1.31* 1.19 1.00 1.04 1.03  CALCIUM  --  8.1* 8.1* 8.5* 8.3* 8.3* 8.2*  MG 1.9  --  2.0  --   --   --   --   PHOS  --  3.0 2.8  --   --   --   --    GFR: Estimated Creatinine Clearance: 54 mL/min (by C-G formula based on SCr of 1.03 mg/dL). Liver Function Tests:  Recent Labs Lab 07/26/17 1546 07/27/17 0203  AST 84*  --   ALT 10*  --   ALKPHOS 87  --   BILITOT 1.9*  --   PROT 6.4*  --   ALBUMIN 3.2* 2.7*   No results for input(s): LIPASE, AMYLASE in the last 168 hours. No results for input(s): AMMONIA in the last 168 hours. Coagulation Profile:  Recent Labs Lab 07/26/17 1546  INR 1.35   Cardiac Enzymes:  Recent Labs Lab 07/26/17 2247 07/27/17 0403  TROPONINI 5.37* 6.02*   BNP (last 3 results) No results for input(s): PROBNP in the last 8760 hours. HbA1C: No results for input(s): HGBA1C in the last 72 hours. CBG:  Recent Labs Lab 07/28/17 1157 07/28/17 1627 07/29/17 0739 07/29/17 1612 07/29/17 2208  GLUCAP 114* 113* 103* 110* 139*   Lipid Profile: No results for input(s): CHOL, HDL, LDLCALC, TRIG, CHOLHDL, LDLDIRECT in the last 72 hours. Thyroid Function Tests: No results for input(s): TSH, T4TOTAL, FREET4, T3FREE, THYROIDAB in the last 72 hours. Anemia Panel: No results for input(s): VITAMINB12, FOLATE, FERRITIN, TIBC, IRON, RETICCTPCT in the last 72 hours. Sepsis Labs:  Recent Labs Lab 07/26/17 1558 07/26/17 1922 07/26/17 2317 07/27/17 0208 07/27/17 0403  PROCALCITON  --   --   --   --  2.83  LATICACIDVEN 2.55* 1.47 2.3* 1.7  --     Recent Results (from the past 240 hour(s))  Culture, blood (Routine x 2)     Status:  None   Collection Time: 07/26/17  4:05 PM  Result Value Ref Range Status   Specimen Description BLOOD LEFT FOREARM  Final   Special Requests   Final    BOTTLES DRAWN AEROBIC AND ANAEROBIC Blood Culture adequate volume   Culture NO GROWTH 5 DAYS  Final   Report Status 07/31/2017 FINAL  Final  Culture, blood (Routine x 2)     Status: None   Collection Time: 07/26/17  4:09 PM  Result  Value Ref Range Status   Specimen Description BLOOD RIGHT FOREARM  Final   Special Requests   Final    BOTTLES DRAWN AEROBIC AND ANAEROBIC Blood Culture results may not be optimal due to an excessive volume of blood received in culture bottles   Culture NO GROWTH 5 DAYS  Final   Report Status 07/31/2017 FINAL  Final  MRSA PCR Screening     Status: None   Collection Time: 07/26/17 10:46 PM  Result Value Ref Range Status   MRSA by PCR NEGATIVE NEGATIVE Final    Comment:        The GeneXpert MRSA Assay (FDA approved for NASAL specimens only), is one component of a comprehensive MRSA colonization surveillance program. It is not intended to diagnose MRSA infection nor to guide or monitor treatment for MRSA infections.   Culture, Urine     Status: Abnormal   Collection Time: 07/27/17  5:15 AM  Result Value Ref Range Status   Specimen Description URINE, CLEAN CATCH  Final   Special Requests NONE  Final   Culture <10,000 COLONIES/mL (A)  Final   Report Status 07/28/2017 FINAL  Final         Radiology Studies: Dg Chest 1 View  Result Date: 07/31/2017 CLINICAL DATA:  Fever and cough for the past 4 days. Recently diagnosed with pneumonia. Hypoxia this morning. EXAM: CHEST 1 VIEW COMPARISON:  Portable chest x-ray of July 28, 2017 FINDINGS: The lungs are reasonably well inflated. Bilateral interstitial and alveolar opacities persist but are slightly less conspicuous today. The retrocardiac region remains dense. There is no pneumothorax or large pleural effusion. The heart is normal in size. The  pulmonary vascularity is not clearly engorged. There is calcification in the wall of the aortic arch. IMPRESSION: Bilateral interstitial and alveolar opacities most compatible with pneumonia. There has been slight interval improvement since the study of 2 days ago. Thoracic aortic atherosclerosis. Electronically Signed   By: David  [place] M.D.   On: 07/31/2017 07:50        Scheduled Meds: . acetylcysteine  3 mL Nebulization Q6H  . albuterol  2.5 mg Nebulization Q6H  . amiodarone  400 mg Oral BID  . carbidopa-levodopa  0.5 tablet Oral Q2200  . carbidopa-levodopa  1 tablet Oral 5 X Daily  . chlorhexidine  15 mL Mouth Rinse BID  . heparin subcutaneous  5,000 Units Subcutaneous Q8H  . mouth rinse  15 mL Mouth Rinse q12n4p  . tamsulosin  0.4 mg Oral QHS   Continuous Infusions: . sodium chloride    . dextrose 5 % and 0.45% NaCl 75 mL/hr at 07/31/17 2132  . piperacillin-tazobactam (ZOSYN)  IV 3.375 g (08/01/17 1159)     LOS: 6 days     Haydee Salter, MD [facility]  If 7PM-7AM, please contact night-coverage [email or web address] Password [facility] 08/01/2017, 5:28 PM

## 2017-08-01 NOTE — Progress Notes (Signed)
Progress Note  Patient Name: Martin Lowe Date of Encounter: 08/01/2017  Primary Cardiologist:   New (Dr. Elease Hashimoto)  Subjective   Much more awake.  Taking POs.  Ate breakfast  Inpatient Medications    Scheduled Meds: . acetylcysteine  3 mL Nebulization Q6H  . albuterol  2.5 mg Nebulization Q6H  . carbidopa-levodopa  0.5 tablet Oral Q2200  . carbidopa-levodopa  1 tablet Oral 5 X Daily  . chlorhexidine  15 mL Mouth Rinse BID  . heparin subcutaneous  5,000 Units Subcutaneous Q8H  . mouth rinse  15 mL Mouth Rinse q12n4p  . tamsulosin  0.4 mg Oral QHS   Continuous Infusions: . sodium chloride    . amiodarone 30 mg/hr (08/01/17 0019)  . dextrose 5 % and 0.45% NaCl 75 mL/hr at 07/31/17 2132  . piperacillin-tazobactam (ZOSYN)  IV 3.375 g (08/01/17 0259)   PRN Meds: sodium chloride, albuterol   Vital Signs    Vitals:   07/31/17 2015 08/01/17 0054 08/01/17 0726 08/01/17 0819  BP: (!) 112/57     Pulse: 61     Resp: (!) 27     Temp: 98.7 F (37.1 C)   97.8 F (36.6 C)  TempSrc: Axillary   Axillary  SpO2: 94% 94% 100%   Weight:      Height:        Intake/Output Summary (Last 24 hours) at 08/01/17 0859 Last data filed at 08/01/17 0300  Gross per 24 hour  Intake           925.45 ml  Output              350 ml  Net           575.45 ml   Filed Weights   07/29/17 0338 07/30/17 0331 07/31/17 0454  Weight: 153 lb 8 oz (69.6 kg) 154 lb 8 oz (70.1 kg) 149 lb 9.6 oz (67.9 kg)    Telemetry    NSR  - Personally Reviewed  ECG    NA - Personally Reviewed  Physical Exam   GENERAL:  Well appearing NECK:  No jugular venous distention, waveform within normal limits, carotid upstroke brisk and symmetric, no bruits, no thyromegaly LUNGS:  Clear to auscultation bilaterally CHEST:  Unremarkable HEART:  PMI not displaced or sustained,S1 and S2 within normal limits, no S3, no S4, no clicks, no rubs, no murmurs ABD:  Flat, positive bowel sounds normal in frequency in pitch, no  bruits, no rebound, no guarding, no midline pulsatile mass, no hepatomegaly, no splenomegaly EXT:  2 plus pulses throughout, no edema, no cyanosis no clubbing   Labs    Chemistry Recent Labs Lab 07/26/17 1546 07/27/17 0203  07/30/17 0306 07/31/17 0900 08/01/17 0530  NA 135 136  < > 139 141 142  K 4.6 4.4  < > 3.1* 4.0 3.6  CL 102 109  < > 110 114* 110  CO2 22 19*  < > 23 19* 23  GLUCOSE 106* 140*  < > 119* 94 123*  BUN 38* 40*  < > CREATININE 1.70* 1.37*  < > 1.00 1.04 1.03  CALCIUM 8.7* 8.1*  < > 8.3* 8.3* 8.2*  PROT 6.4*  --   --   --   --   --   ALBUMIN 3.2* 2.7*  --   --   --   --   AST 84*  --   --   --   --   --  ALT 10*  --   --   --   --   --   ALKPHOS 87  --   --   --   --   --   BILITOT 1.9*  --   --   --   --   --   GFRNONAA 36* 47*  < > >60 >60 >60  GFRAA 42* 54*  < > >60 >60 >60  ANIONGAP 11 8  < > < > = values in this interval not displayed.   Hematology  Recent Labs Lab 07/30/17 0306 07/31/17 0559 08/01/17 0530  WBC 6.0 6.1 6.0  RBC 3.87* 3.85* 3.80*  HGB 11.4* 11.2* 11.4*  HCT 35.5* 35.6* 35.2*  MCV 91.7 92.5 92.6  MCH 29.5 29.1 30.0  MCHC 32.1 31.5 32.4  RDW 14.3 14.6 15.2  PLT 243 223 246    Cardiac Enzymes  Recent Labs Lab 07/26/17 2247 07/27/17 0403  TROPONINI 5.37* 6.02*     Recent Labs Lab 07/26/17 1558  TROPIPOC 3.12*     BNP  Recent Labs Lab 07/26/17 1546  BNP 939.5*     DDimer   Recent Labs Lab 07/26/17 2247  DDIMER 2.12*     Radiology    Dg Chest 1 View  Result Date: 07/31/2017 CLINICAL DATA:  Fever and cough for the past 4 days. Recently diagnosed with pneumonia. Hypoxia this morning. EXAM: CHEST 1 VIEW COMPARISON:  Portable chest x-ray of July 28, 2017 FINDINGS: The lungs are reasonably well inflated. Bilateral interstitial and alveolar opacities persist but are slightly less conspicuous today. The retrocardiac region remains dense. There is no pneumothorax or large pleural  effusion. The heart is normal in size. The pulmonary vascularity is not clearly engorged. There is calcification in the wall of the aortic arch. IMPRESSION: Bilateral interstitial and alveolar opacities most compatible with pneumonia. There has been slight interval improvement since the study of 2 days ago. Thoracic aortic atherosclerosis. Electronically Signed   By: David  [place] M.D.   On: 07/31/2017 07:50    Cardiac Studies   Echo:  - Left ventricle: Septal and posterior lateral hypokinesis. The   cavity size was mildly dilated. Wall thickness was normal.   Systolic function was mildly to moderately reduced. The estimated   ejection fraction was in the range of 40% to 45%. - Left atrium: The atrium was mildly dilated. - Atrial septum: A patent foramen ovale cannot be excluded.   Patient Profile     83 y.o. male with a hx of Parkinson's disease, BPH, dementia, failure to thrive, and frequent falls residing in hospice home who is being seen for the evaluation of atrial fib and elevated troponin at the request of Dr. Donnald Garre.  Assessment & Plan    ATRIAL FIB:   Not a candidate for oral anticoagulation.   He is taking POs and I will stop the IV amio.  I will start PO although he might not need this long term.    NSTEMI    Medical management.  EF is mildy reduced but medical management only.      Signed, Rollene Rotunda, MD  08/01/2017, 8:59 AM

## 2017-08-02 ENCOUNTER — Encounter (HOSPITAL_COMMUNITY): Payer: Self-pay | Admitting: General Practice

## 2017-08-02 DIAGNOSIS — R1310 Dysphagia, unspecified: Secondary | ICD-10-CM

## 2017-08-02 LAB — BASIC METABOLIC PANEL
ANION GAP: 5 (ref 5–15)
BUN: 9 mg/dL (ref 6–20)
CALCIUM: 8.3 mg/dL — AB (ref 8.9–10.3)
CO2: 24 mmol/L (ref 22–32)
CREATININE: 0.96 mg/dL (ref 0.61–1.24)
Chloride: 111 mmol/L (ref 101–111)
Glucose, Bld: 107 mg/dL — ABNORMAL HIGH (ref 65–99)
Potassium: 3.5 mmol/L (ref 3.5–5.1)
SODIUM: 140 mmol/L (ref 135–145)

## 2017-08-02 LAB — CBC
HCT: 33.6 % — ABNORMAL LOW (ref 39.0–52.0)
Hemoglobin: 11 g/dL — ABNORMAL LOW (ref 13.0–17.0)
MCH: 30.6 pg (ref 26.0–34.0)
MCHC: 32.7 g/dL (ref 30.0–36.0)
MCV: 93.3 fL (ref 78.0–100.0)
PLATELETS: 227 10*3/uL (ref 150–400)
RBC: 3.6 MIL/uL — AB (ref 4.22–5.81)
RDW: 15 % (ref 11.5–15.5)
WBC: 4.7 10*3/uL (ref 4.0–10.5)

## 2017-08-02 LAB — GLUCOSE, CAPILLARY: GLUCOSE-CAPILLARY: 102 mg/dL — AB (ref 65–99)

## 2017-08-02 MED ORDER — MORPHINE SULFATE (PF) 2 MG/ML IV SOLN
2.0000 mg | INTRAVENOUS | Status: DC | PRN
  Administered 2017-08-03 – 2017-08-04 (×3): 2 mg via INTRAVENOUS
  Filled 2017-08-02 (×3): qty 1

## 2017-08-02 MED ORDER — ENSURE ENLIVE PO LIQD
237.0000 mL | Freq: Two times a day (BID) | ORAL | Status: DC
  Administered 2017-08-02 – 2017-08-03 (×2): 237 mL via ORAL
  Filled 2017-08-02: qty 237

## 2017-08-02 MED ORDER — ACETAMINOPHEN 325 MG PO TABS
650.0000 mg | ORAL_TABLET | ORAL | Status: DC | PRN
  Administered 2017-08-02: 650 mg via ORAL
  Filled 2017-08-02: qty 2

## 2017-08-02 NOTE — Plan of Care (Signed)
Problem: Skin Integrity: Goal: Risk for impaired skin integrity will decrease Outcome: Progressing Skin care protocol initiated. Q2 turns implemented. Family educated.   Problem: Tissue Perfusion: Goal: Risk factors for ineffective tissue perfusion will decrease Outcome: Progressing Heparin sq administered. Family educated.

## 2017-08-02 NOTE — Progress Notes (Addendum)
PROGRESS NOTE    Martin Lowe  ZOX:[id number] DOB: 11/02/1934 DOA: 07/26/2017 PCP: Cheral Bay, MD    Brief Narrative: 83 year old male with end-stage Parkinson's disease, dementia, frequent falls, admitted with fever cough shortness of breath found to have aspiration pneumonia. Patient was being treated with vancomycin and cefepime. Repeat chest x-ray done does not show any improvement in his infiltrates. He has MRSA PCR was negative. Patient seen today with many family members at the bedside. I have answered all their questions. I have discussed with them about the possibility of this patient not getting better. They do not want a PEG tube placed for feeding. Patient improved but still high risk for aspiration. Family has agreed to palliative care due to agreed upon comfort feeding.  Family agreeable to morphine for pain and air hunger but does not want full comfort care at this time.   Assessment & Plan:   Principal Problem:   HCAP (healthcare-associated pneumonia) Active Problems:   Dementia due to Parkinson's disease with behavioral disturbance (HCC)   Sepsis (HCC)   Troponin level elevated   PAF (paroxysmal atrial fibrillation) (HCC)   Atrial fibrillation with RVR (HCC)   Pressure injury of skin   Palliative care by specialist   DNR (do not resuscitate)   Dysphagia  Multi focal pneumonia-aspiration/hcap dc vanc and cefepime since mrsa negative and treating aspiration. Started zosyn (9/24). Will likely proceed to comfort care tomorrow. High risk for aspiration, family has agreed to comfort feeding, palliative care to see patient and educate family and axis with disposition. Reemphasized education given previously about patient's aspiration risk and recurrence of aspiration pneumonia. They voiced understanding but clinged fast to hope. Advised that discharge to hospice house is strongly advised again today Rapid afib rate controlled on amio. AKI, resolved follow up  levels. HYPOTHYROIDSM continue synthroid. PARKINSONS DZ, cont sinemet.   Plan discussed with the family. Patient had significant improvement. He is now alert and eating.    DVT prophylaxis heparin Code Status:dnr Family Communication: dw family Disposition Plan: tbd   Consultants: cardiology  Procedures: none  Antimicrobials: zosyn   Subjective: Spoke to 4 family members today. Patient with cough. Family understands is due to aspiration comfort feeding. Family is seeking hospice care  Objective: Vitals:   08/02/17 0406 08/02/17 0929 08/02/17 1423 08/02/17 1448  BP: (!) 121/92  105/65   Pulse: (!) 53     Resp: (!) 21     Temp:   99.1 F (37.3 C)   TempSrc:   Axillary   SpO2: 97% 100% 96% 100%  Weight: 69.9 kg (154 lb 3.2 oz)     Height:        Intake/Output Summary (Last 24 hours) at 08/02/17 1813 Last data filed at 08/01/17 2300  Gross per 24 hour  Intake              300 ml  Output              250 ml  Net               50 ml   Filed Weights   07/30/17 0331 07/31/17 0454 08/02/17 0406  Weight: 70.1 kg (154 lb 8 oz) 67.9 kg (149 lb 9.6 oz) 69.9 kg (154 lb 3.2 oz)    Examination:  General exam: Appears calm and comfortable. A&Ox1 Respiratory system: rhonchi auscultation & rales. Respiratory effort Increased. Cardiovascular system: S1 & S2 heard, RRR. No JVD, murmurs, rubs, gallops or clicks. No pedal  edema. Gastrointestinal system: Abdomen is nondistended, soft and nontender. No organomegaly or masses felt. Normal bowel sounds heard. Central nervous system: No focal neurological deficits. Extremities: Moving all extremities Skin: No rashes, lesions or ulcers Psychiatry:. Mood & affect appropriate.     Data Reviewed: I have personally reviewed following labs and imaging studies  CBC:  Recent Labs Lab 07/29/17 0656 07/30/17 0306 07/31/17 0559 08/01/17 0530 08/02/17 0551  WBC 5.8 6.0 6.1 6.0 4.7  HGB 11.2* 11.4* 11.2* 11.4* 11.0*  HCT 34.5* 35.5*  35.6* 35.2* 33.6*  MCV 91.5 91.7 92.5 92.6 93.3  PLT 266 243 223 246 227   Basic Metabolic Panel:  Recent Labs Lab 07/26/17 2247  07/27/17 0203 07/27/17 0403 07/28/17 0635 07/30/17 0306 07/31/17 0900 08/01/17 0530 08/02/17 0551  NA  --   < > 136 136 140 139 141 142 140  K  --   < > 4.4 4.3 3.7 3.1* 4.0 3.6 3.5  CL  --   < > 109 109 109 110 114* 110 111  CO2  --   < > 19* 20* 24 23 19* 23 24  GLUCOSE  --   < > 140* 122* 82 119* 94 123* 107*  BUN  --   < > 40* 38* 38* CREATININE  --   < > 1.37* 1.31* 1.19 1.00 1.04 1.03 0.96  CALCIUM  --   < > 8.1* 8.1* 8.5* 8.3* 8.3* 8.2* 8.3*  MG 1.9  --   --  2.0  --   --   --   --   --   PHOS  --   --  3.0 2.8  --   --   --   --   --   < > = values in this interval not displayed. GFR: Estimated Creatinine Clearance: 59.7 mL/min (by C-G formula based on SCr of 0.96 mg/dL). Liver Function Tests:  Recent Labs Lab 07/27/17 0203  ALBUMIN 2.7*   No results for input(s): LIPASE, AMYLASE in the last 168 hours. No results for input(s): AMMONIA in the last 168 hours. Coagulation Profile: No results for input(s): INR, PROTIME in the last 168 hours. Cardiac Enzymes:  Recent Labs Lab 07/26/17 2247 07/27/17 0403  TROPONINI 5.37* 6.02*   BNP (last 3 results) No results for input(s): PROBNP in the last 8760 hours. HbA1C: No results for input(s): HGBA1C in the last 72 hours. CBG:  Recent Labs Lab 07/28/17 1627 07/29/17 0739 07/29/17 1612 07/29/17 2208 08/02/17 0253  GLUCAP 113* 103* 110* 139* 102*   Lipid Profile: No results for input(s): CHOL, HDL, LDLCALC, TRIG, CHOLHDL, LDLDIRECT in the last 72 hours. Thyroid Function Tests: No results for input(s): TSH, T4TOTAL, FREET4, T3FREE, THYROIDAB in the last 72 hours. Anemia Panel: No results for input(s): VITAMINB12, FOLATE, FERRITIN, TIBC, IRON, RETICCTPCT in the last 72 hours. Sepsis Labs:  Recent Labs Lab 07/26/17 1922 07/26/17 2317 07/27/17 0208 07/27/17 0403   PROCALCITON  --   --   --  2.83  LATICACIDVEN 1.47 2.3* 1.7  --     Recent Results (from the past 240 hour(s))  Culture, blood (Routine x 2)     Status: None   Collection Time: 07/26/17  4:05 PM  Result Value Ref Range Status   Specimen Description BLOOD LEFT FOREARM  Final   Special Requests   Final    BOTTLES DRAWN AEROBIC AND ANAEROBIC Blood Culture adequate volume   Culture NO GROWTH 5 DAYS  Final  Report Status 07/31/2017 FINAL  Final  Culture, blood (Routine x 2)     Status: None   Collection Time: 07/26/17  4:09 PM  Result Value Ref Range Status   Specimen Description BLOOD RIGHT FOREARM  Final   Special Requests   Final    BOTTLES DRAWN AEROBIC AND ANAEROBIC Blood Culture results may not be optimal due to an excessive volume of blood received in culture bottles   Culture NO GROWTH 5 DAYS  Final   Report Status 07/31/2017 FINAL  Final  MRSA PCR Screening     Status: None   Collection Time: 07/26/17 10:46 PM  Result Value Ref Range Status   MRSA by PCR NEGATIVE NEGATIVE Final    Comment:        The GeneXpert MRSA Assay (FDA approved for NASAL specimens only), is one component of a comprehensive MRSA colonization surveillance program. It is not intended to diagnose MRSA infection nor to guide or monitor treatment for MRSA infections.   Culture, Urine     Status: Abnormal   Collection Time: 07/27/17  5:15 AM  Result Value Ref Range Status   Specimen Description URINE, CLEAN CATCH  Final   Special Requests NONE  Final   Culture <10,000 COLONIES/mL (A)  Final   Report Status 07/28/2017 FINAL  Final         Radiology Studies: No results found.      Scheduled Meds: . acetylcysteine  3 mL Nebulization Q6H  . albuterol  2.5 mg Nebulization Q6H  . amiodarone  400 mg Oral BID  . carbidopa-levodopa  0.5 tablet Oral Q2200  . carbidopa-levodopa  1 tablet Oral 5 X Daily  . chlorhexidine  15 mL Mouth Rinse BID  . feeding supplement (ENSURE ENLIVE)  237 mL Oral  BID BM  . heparin subcutaneous  5,000 Units Subcutaneous Q8H  . mouth rinse  15 mL Mouth Rinse q12n4p  . tamsulosin  0.4 mg Oral QHS   Continuous Infusions: . sodium chloride    . dextrose 5 % and 0.45% NaCl 75 mL/hr at 07/31/17 2132  . piperacillin-tazobactam (ZOSYN)  IV 3.375 g (08/02/17 1751)     LOS: 7 days     Haydee Salter, MD [facility]  If 7PM-7AM, please contact night-coverage [email or web address] Password [facility] 08/02/2017, 6:13 PM

## 2017-08-02 NOTE — Progress Notes (Signed)
Pharmacy Antibiotic Note  Shanti Eichel is a 83 y.o. male with concern of aspiration PNA on zosyn (day 3). He appears to be tolerating some po medication  -WBC= WNL, afeb, SCr= 0.96, CrCl ~ 60, cultures, ngtd, MRSA PCR- negative  Plan: -Zosyn 3.375gmIV q8h -Consider change to augmentin for total of 7 days -Will follow renal function, cultures and clinical progress  Height:  (177.8 cm) Weight: 154 lb 3.2 oz (69.9 kg) IBW/kg (Calculated) : 73  No data recorded.   Recent Labs Lab 07/26/17 1558 07/26/17 1922 07/26/17 2317  07/27/17 0208  07/28/17 1610 07/29/17 0656 07/30/17 0306 07/31/17 0559 07/31/17 0900 08/01/17 0530 08/02/17 0551  WBC  --   --   --   --   --   --  10.0 5.8 6.0 6.1  --  6.0 4.7  CREATININE  --   --   --   < >  --   < > 1.19  --  1.00  --  1.04 1.03 0.96  LATICACIDVEN 2.55* 1.47 2.3*  --  1.7  --   --   --   --   --   --   --   --   < > = values in this interval not displayed.  Estimated Creatinine Clearance: 59.7 mL/min (by C-G formula based on SCr of 0.96 mg/dL).    Allergies  Allergen Reactions  . Ciprofloxacin Hives and Rash  . Nitrofurantoin Rash    Antimicrobials this admission: 9/20 vancomycin > 9/24 9/20 cefepime > 9/24 9/24 zosyn>>  Dose adjustments this admission:   Microbiology results: 9/20 blood cx: ngtd 9/21 urine- < 10 K 9/20 MRSA PCR- neg  Harland German, Pharm D 08/02/2017 11:53 AM

## 2017-08-02 NOTE — Progress Notes (Signed)
Attempted NTS per MD request.  Per family we will only do this to pts comfort level.  He did flinch and pt requested we stop.  NTS not performed.

## 2017-08-03 ENCOUNTER — Inpatient Hospital Stay (HOSPITAL_COMMUNITY): Payer: Medicare Other

## 2017-08-03 LAB — CBC
HEMATOCRIT: 37.2 % — AB (ref 39.0–52.0)
Hemoglobin: 11.6 g/dL — ABNORMAL LOW (ref 13.0–17.0)
MCH: 29.1 pg (ref 26.0–34.0)
MCHC: 31.2 g/dL (ref 30.0–36.0)
MCV: 93.5 fL (ref 78.0–100.0)
PLATELETS: 245 10*3/uL (ref 150–400)
RBC: 3.98 MIL/uL — AB (ref 4.22–5.81)
RDW: 14.7 % (ref 11.5–15.5)
WBC: 8.1 10*3/uL (ref 4.0–10.5)

## 2017-08-03 MED ORDER — ALBUTEROL SULFATE (2.5 MG/3ML) 0.083% IN NEBU
2.5000 mg | INHALATION_SOLUTION | Freq: Three times a day (TID) | RESPIRATORY_TRACT | Status: DC
  Administered 2017-08-03 – 2017-08-04 (×4): 2.5 mg via RESPIRATORY_TRACT
  Filled 2017-08-03 (×5): qty 3

## 2017-08-03 MED ORDER — LORAZEPAM 2 MG/ML IJ SOLN: 1.0000 mg | INTRAMUSCULAR | Status: DC | PRN

## 2017-08-03 MED ORDER — GLYCOPYRROLATE 0.2 MG/ML IJ SOLN: 0.4000 mg | INTRAMUSCULAR | Status: DC | PRN

## 2017-08-03 MED ORDER — ACETYLCYSTEINE 20 % IN SOLN
3.0000 mL | Freq: Three times a day (TID) | RESPIRATORY_TRACT | Status: DC
  Administered 2017-08-03 (×2): 3 mL via RESPIRATORY_TRACT
  Filled 2017-08-03 (×2): qty 4

## 2017-08-03 MED ORDER — AMIODARONE HCL 200 MG PO TABS
200.0000 mg | ORAL_TABLET | Freq: Every day | ORAL | Status: DC
  Administered 2017-08-03: 200 mg via ORAL
  Filled 2017-08-03: qty 1

## 2017-08-03 NOTE — Progress Notes (Signed)
Nutrition Brief Note  Patient identified on the Malnutrition Screening Tool (MST) Report.  Wt Readings from Last 15 Encounters:  08/03/17 155 lb 6.4 oz (70.5 kg)  04/04/17 156 lb 8.4 oz (71 kg)  03/07/17 156 lb 9.6 oz (71 kg)  02/05/17 149 lb 9.6 oz (67.9 kg)  02/01/17 170 lb (77.1 kg)    Body mass index is 22.3 kg/m. Patient meets criteria for normal weight based on current BMI.   Current diet order is dysphagia 2 with thin liquids. Patient/family does not want a PEG. They are willing for him to take PO's for comfort feedings with known risk of aspiration.   Labs and medications reviewed.  Palliative Care Team is following with recommendations for residential hospice care.   No nutrition interventions warranted at this time. If nutrition issues arise, please consult RD.   Joaquin Courts, RD, LDN, CNSC Pager [phone] After Hours Pager [phone]

## 2017-08-03 NOTE — Plan of Care (Signed)
Problem: Respiratory: Goal: Ability to maintain adequate ventilation will improve Outcome: Progressing Patient maintains adequate oxygen saturation levels between 96-98% on room air. Work of breathing is increased with accessory muscle use

## 2017-08-03 NOTE — Plan of Care (Signed)
Problem: Fluid Volume: Goal: Ability to maintain a balanced intake and output will improve Outcome: Not Progressing Pt is unable to gain adequate po fluid r/t aspiration. IV fluid infusing.

## 2017-08-03 NOTE — Progress Notes (Signed)
   No new suggestions.  We will sign off call with questions.  I reduced the dose of amiodarone.  This could certainly be discontinued if the plan is for meds for comfort care only.

## 2017-08-03 NOTE — Care Management Important Message (Signed)
Important Message  Patient Details  Name: Levar Fayson MRN: [id number] Date of Birth: 1933/11/16   Medicare Important Message Given:  Yes    Kyla Balzarine 08/03/2017, 4:02 PM

## 2017-08-03 NOTE — Consult Note (Signed)
[facility]: Received call from Palliative care to meet with family and evaluate for HHHP. Pt in room resting at this time has had recent dose of morphine IV which seems to of helped him rest according to the family. He has not been eating and has tried to refuse po meds at times. Discussed hospice care at [facility] and pt/family agree to philosophy. Spoke to our Wellsite geologist and the pt was approved to transport to hospice facility. Family signed consents We will be ready and able to receive pt in am 08/04/17. Pt will need to transport by ambulance. Norm Parcel RN

## 2017-08-03 NOTE — Progress Notes (Signed)
All these are actually for 2:30pm 08/03/17

## 2017-08-03 NOTE — Progress Notes (Signed)
PROGRESS NOTE    Martin Lowe  ZOX:[id number] DOB: 1934-04-08 DOA: 07/26/2017 PCP: Cheral Bay, MD    Brief Narrative: 83 year old male with end-stage Parkinson's disease, dementia, frequent falls, admitted with fever cough shortness of breath found to have aspiration pneumonia. Patient was being treated with vancomycin and cefepime. Repeat chest x-ray done does not show any improvement in his infiltrates. He has MRSA PCR was negative. Patient seen today with many family members at the bedside. I have answered all their questions. I have discussed with them about the possibility of this patient not getting better. They do not want a PEG tube placed for feeding. Patient improved but still high risk for aspiration. Family has agreed to palliative care due to agreed upon comfort feeding.  Family agreeable to morphine for pain and air hunger but does not want full comfort care at this time. Will d/c w/ hospice care.   Assessment & Plan:   Principal Problem:   HCAP (healthcare-associated pneumonia) Active Problems:   Dementia due to Parkinson's disease with behavioral disturbance (HCC)   Sepsis (HCC)   Troponin level elevated   PAF (paroxysmal atrial fibrillation) (HCC)   Atrial fibrillation with RVR (HCC)   Pressure injury of skin   Palliative care by specialist   DNR (do not resuscitate)   Dysphagia  Multi focal pneumonia-aspiration/hcap dc vanc and cefepime since mrsa negative and treating aspiration. Started zosyn (9/24). Will likely proceed to comfort care tomorrow. High risk for aspiration, family has agreed to comfort feeding, palliative care to see patient and educate family and axis with disposition. Reemphasized education given previously about patient's aspiration risk and recurrence of aspiration pneumonia. They voiced understanding but clinged fast to hope. Advised that discharge to hospice house is strongly advised today. Rapid afib rate controlled on amio. AKI, resolved  follow up levels. HYPOTHYROIDSM continue synthroid. PARKINSONS DZ, cont sinemet.   Plan discussed with the family.    DVT prophylaxis heparin Code Status:dnr Family Communication: dw family Disposition Plan: tbd   Consultants: cardiology  Procedures: none  Antimicrobials: zosyn   Subjective:  Family is seeking hospice care. Not eating much.  Objective: Vitals:   08/03/17 0900 08/03/17 0951 08/03/17 1430 08/03/17 1457  BP: (!) 103/51  128/88   Pulse: (!) 57  68   Resp: (!) 24  (!) 22   Temp: 97.9 F (36.6 C)  99.8 F (37.7 C)   TempSrc: Oral  Axillary   SpO2: 94% 98% 97% 99%  Weight:      Height:        Intake/Output Summary (Last 24 hours) at 08/03/17 1844 Last data filed at 08/03/17 1355  Gross per 24 hour  Intake             97.5 ml  Output              675 ml  Net           -577.5 ml   Filed Weights   07/31/17 0454 08/02/17 0406 08/03/17 0604  Weight: 67.9 kg (149 lb 9.6 oz) 69.9 kg (154 lb 3.2 oz) 70.5 kg (155 lb 6.4 oz)    Examination:  General exam: Appears calm and comfortable. A&Ox1 Respiratory system: rhonchi auscultation & rales. Respiratory effort Increased. Cardiovascular system: S1 & S2 heard, RRR. No JVD, murmurs, rubs, gallops or clicks. No pedal edema. Gastrointestinal system: Abdomen is nondistended, soft and nontender. No organomegaly or masses felt. Normal bowel sounds heard. Central nervous system: No focal neurological deficits.  Extremities: Moving all extremities Skin: No rashes, lesions or ulcers Psychiatry:. Mood & affect appropriate.     Data Reviewed: I have personally reviewed following labs and imaging studies  CBC:  Recent Labs Lab 07/30/17 0306 07/31/17 0559 08/01/17 0530 08/02/17 0551 08/03/17 0437  WBC 6.0 6.1 6.0 4.7 8.1  HGB 11.4* 11.2* 11.4* 11.0* 11.6*  HCT 35.5* 35.6* 35.2* 33.6* 37.2*  MCV 91.7 92.5 92.6 93.3 93.5  PLT 243 223 246 227 245   Basic Metabolic Panel:  Recent Labs Lab 07/28/17 0635  07/30/17 0306 07/31/17 0900 08/01/17 0530 08/02/17 0551  NA 140 139 141 142 140  K 3.7 3.1* 4.0 3.6 3.5  CL 109 110 114* 110 111  CO2 24 23 19* 23 24  GLUCOSE 82 119* 94 123* 107*  BUN 38* CREATININE 1.19 1.00 1.04 1.03 0.96  CALCIUM 8.5* 8.3* 8.3* 8.2* 8.3*   GFR: Estimated Creatinine Clearance: 60.2 mL/min (by C-G formula based on SCr of 0.96 mg/dL). Liver Function Tests: No results for input(s): AST, ALT, ALKPHOS, BILITOT, PROT, ALBUMIN in the last 168 hours. No results for input(s): LIPASE, AMYLASE in the last 168 hours. No results for input(s): AMMONIA in the last 168 hours. Coagulation Profile: No results for input(s): INR, PROTIME in the last 168 hours. Cardiac Enzymes: No results for input(s): CKTOTAL, CKMB, CKMBINDEX, TROPONINI in the last 168 hours. BNP (last 3 results) No results for input(s): PROBNP in the last 8760 hours. HbA1C: No results for input(s): HGBA1C in the last 72 hours. CBG:  Recent Labs Lab 07/28/17 1627 07/29/17 0739 07/29/17 1612 07/29/17 2208 08/02/17 0253  GLUCAP 113* 103* 110* 139* 102*   Lipid Profile: No results for input(s): CHOL, HDL, LDLCALC, TRIG, CHOLHDL, LDLDIRECT in the last 72 hours. Thyroid Function Tests: No results for input(s): TSH, T4TOTAL, FREET4, T3FREE, THYROIDAB in the last 72 hours. Anemia Panel: No results for input(s): VITAMINB12, FOLATE, FERRITIN, TIBC, IRON, RETICCTPCT in the last 72 hours. Sepsis Labs: No results for input(s): PROCALCITON, LATICACIDVEN in the last 168 hours.  Recent Results (from the past 240 hour(s))  Culture, blood (Routine x 2)     Status: None   Collection Time: 07/26/17  4:05 PM  Result Value Ref Range Status   Specimen Description BLOOD LEFT FOREARM  Final   Special Requests   Final    BOTTLES DRAWN AEROBIC AND ANAEROBIC Blood Culture adequate volume   Culture NO GROWTH 5 DAYS  Final   Report Status 07/31/2017 FINAL  Final  Culture, blood (Routine x 2)     Status: None     Collection Time: 07/26/17  4:09 PM  Result Value Ref Range Status   Specimen Description BLOOD RIGHT FOREARM  Final   Special Requests   Final    BOTTLES DRAWN AEROBIC AND ANAEROBIC Blood Culture results may not be optimal due to an excessive volume of blood received in culture bottles   Culture NO GROWTH 5 DAYS  Final   Report Status 07/31/2017 FINAL  Final  MRSA PCR Screening     Status: None   Collection Time: 07/26/17 10:46 PM  Result Value Ref Range Status   MRSA by PCR NEGATIVE NEGATIVE Final    Comment:        The GeneXpert MRSA Assay (FDA approved for NASAL specimens only), is one component of a comprehensive MRSA colonization surveillance program. It is not intended to diagnose MRSA infection nor to guide or monitor treatment for MRSA infections.  Culture, Urine     Status: Abnormal   Collection Time: 07/27/17  5:15 AM  Result Value Ref Range Status   Specimen Description URINE, CLEAN CATCH  Final   Special Requests NONE  Final   Culture <10,000 COLONIES/mL (A)  Final   Report Status 07/28/2017 FINAL  Final         Radiology Studies: Dg Chest Port 1 View  Result Date: 08/03/2017 CLINICAL DATA:  SOB (shortness of breath) 161096, cough (slightly productive) EXAM: PORTABLE CHEST 1 VIEW COMPARISON:  07/31/2017 FINDINGS: Midline trachea. Cardiomegaly accentuated by AP portable technique. Atherosclerosis in the transverse aorta. Probable layering bilateral pleural effusions. No pneumothorax. Skin folds over the left hemithorax. Lower lobe predominant interstitial and airspace disease is similar to slightly increased, likely accentuated by lower lung volumes today. IMPRESSION: Similar to slightly increased interstitial and airspace disease. Probable layering bilateral pleural effusions. Favor pulmonary edema. Concurrent infection or aspiration could look similar. Cardiomegaly with aortic atherosclerosis. Electronically Signed   By: Jeronimo Greaves M.D.   On: 08/03/2017 11:33         Scheduled Meds: . albuterol  2.5 mg Nebulization TID  . chlorhexidine  15 mL Mouth Rinse BID  . feeding supplement (ENSURE ENLIVE)  237 mL Oral BID BM  . mouth rinse  15 mL Mouth Rinse q12n4p   Continuous Infusions: . sodium chloride       LOS: 8 days     Haydee Salter, MD [facility]  If 7PM-7AM, please contact night-coverage [email or web address] Password [facility] 08/03/2017, 6:44 PM

## 2017-08-03 NOTE — Progress Notes (Addendum)
Palliative Medicine RN Note: Rec'd call from pt's RN that family is requesting change to comfort care.  Met with wife, daughter, granddaughter. All verbalize understanding that patient is dying despite therapies and that they want to focus on comfort. They specifically asked about stopping the IV abx (they report that they have been told the pneumonia is worsening despite them); they do not want to continue them if they aren't going to affect the outcome, so I obtained the order to stop from Dr Hilma Favors.  We discussed changes in medications and orders that come with comfort care. They are requesting foley catheter, as the condom cath is not staying on. They are absolutely sure they want to d/c with hospice care. We discussed going home with sitters versus inpt hospice. At this time, the patient is only now starting to get morphine, and this medicine will likely have to be adjusted frequently as he declines. He has some audible secretions, but they are not continually present yet. Cr <1. No relevant drug allergies.   Family would like to pursue placement at [facility], as he has been there for respite before. Their rep is here to meet with them/plan for likely transfer tomorrow.  Paged Dr Aggie Moats with update.  Obtained comfort orders from Dr Hilma Favors & notified weekend palliative team of changes.  Martin Skiff Jarvin Ogren, RN, BSN, [facility] 08/03/2017 4:03 PM Cell [phone] 8:00-4:00 Monday-Friday Office [phone]

## 2017-08-03 NOTE — Progress Notes (Signed)
CSW joined palliative meeting with family. Family have decided on comfort care for patient. [facility] meeting with family today and indicate they have bed available for patient admission to residential hospice tomorrow, 9/29. CSW to support with discharge to residential hospice.  Abigail Butts, LCSWA [phone]

## 2017-08-04 MED ORDER — ACETAMINOPHEN 325 MG RE SUPP: 650.0000 mg | Freq: Three times a day (TID) | RECTAL | 1 refills | Status: AC | PRN

## 2017-08-04 NOTE — Clinical Social Work Note (Signed)
CSW notified by [facility], pt's bed is available. MD, [facility] notified.  Clinical Social Worker facilitated patient discharge including contacting patient family (Wife) and facility to confirm patient discharge plans. Clinical information faxed to facility and family agreeable with plan.  CSW arranged ambulance transport via [facility] to [facility]. RN to call report to [phone] prior to discharge.  Clinical Social Worker will sign off for now as social work intervention is no longer needed. Please consult [place] again if new need arises.  Jamerius Boeckman B. Gean Quint Clinical Social Work Dept Weekend Social Worker [phone] 11:58 AM

## 2017-08-04 NOTE — Discharge Summary (Signed)
Physician Discharge Summary  Martin Lowe ZOX:[id number] DOB: 03/13/1934 DOA: 07/26/2017  PCP: Cheral Bay, MD  Admit date: 07/26/2017 Discharge date: 08/04/2017  Time spent: 35 minutes  Recommendations for Outpatient Follow-up:  1. Hospice   Discharge Diagnoses:  Principal Problem:   HCAP (healthcare-associated pneumonia) Active Problems:   Dementia due to Parkinson's disease with behavioral disturbance (HCC)   Sepsis (HCC)   Troponin level elevated   PAF (paroxysmal atrial fibrillation) (HCC)   Atrial fibrillation with RVR (HCC)   Pressure injury of skin   Palliative care by specialist   DNR (do not resuscitate)   Dysphagia   Discharge Condition: stable  Diet recommendation: Heart Healthy  Filed Weights   07/31/17 0454 08/02/17 0406 08/03/17 0604  Weight: 67.9 kg (149 lb 9.6 oz) 69.9 kg (154 lb 3.2 oz) 70.5 kg (155 lb 6.4 oz)    History of present illness:  HPI obtained from medical chart review and patient's wife, daughter, and son at the bedside due to acute encephalopathy.   83 year old with past medical history of Parkinson's disease, BPH, dementia, hypothyroidism, failure to thrive, urinary incontinence, HOH, and frequent falls who presented to [facility] ER with complaints of fever and cough.   Patient is a resident at [facility] since March 2018.  He goes by "Martin Lowe".  He is normally able to communicate but confused and uses a wheelchair for mobilization but often attempts to walk causing him to have multiple falls.  His family reports increased dysphagia and oral intake since Tuesday and today with increased lethargy.  Noted to have a 102.7 fever and CXR performed at SNF reported a right pneumonia.  He was sent to the ER for further treatment.  In the ER, patient was found to be in rapid Afib w/ RVR and initially normotensive, oxygen sats 83% on RA, and febrile.  CXR noted for multifocal pneumonia right worse than left.  Head CT negative for acute process.  Labs  significant for WBC 11.7, LA 2.55 -> 1.48, sCr 1.7, +UTI, BNP 939, and troponin 3.12 with EKG showing diffuse ST depression and Rapid Afib.  He was treated with 3L NS bolus, vancomycin and cefepime.  Cardiology consulted.  He spontaneously converted to NSR in the 70's but progressively became more hypotensive requiring low-dose vasopressor.   Therefore, PCCM to admit.    Hospital Course:  83 year old male with end-stage Parkinson's disease, dementia, frequent falls, admitted with fever cough shortness of breath found to have aspiration pneumonia. Patient was being treated with vancomycin and cefepime. Repeat chest x-ray done does not show any improvement in his infiltrates. He has MRSA PCR was negative. Patient seen today with many family members at the bedside. I have answered all their questions. Discussed with them about the possibility of this patient not getting better. They do not want a PEG tube placed for feeding. Patient improved but still high risk for aspiration. Family has agreed to palliative care due to agreed upon comfort feeding. Abx switched to zosyn and pt became more alert. Continued to aspirate. PT d/c.  Procedures: ECHO Study Conclusions  - Left ventricle: Septal and posterior lateral hypokinesis. The   cavity size was mildly dilated. Wall thickness was normal.   Systolic function was mildly to moderately reduced. The estimated   ejection fraction was in the range of 40% to 45%. - Left atrium: The atrium was mildly dilated. - Atrial septum: A patent foramen ovale cannot be excluded.  Consultations:  Hospice, Palliative, Cardiology  Discharge  Exam: Vitals:   08/04/17 0830 08/04/17 0847  BP: (!) 146/92   Pulse: 71   Resp: 12   Temp: 99 F (37.2 C)   SpO2: 96% 96%    General: Mild resp distress, NCAT Cardiovascular: RRR, no MRG Respiratory: Rhonchi, no W/R  Discharge Instructions   Discharge Instructions    Call MD for:  difficulty breathing, headache or  visual disturbances    Complete by:  As directed    Call MD for:  persistant nausea and vomiting    Complete by:  As directed    Call MD for:  severe uncontrolled pain    Complete by:  As directed    Call MD for:  temperature >100.4    Complete by:  As directed    Diet - low sodium heart healthy    Complete by:  As directed    Increase activity slowly    Complete by:  As directed      Discharge Medication List as of 08/04/2017 12:35 PM    START taking these medications   Details  acetaminophen (TYLENOL) 325 MG suppository Place 2 suppositories (650 mg total) rectally every 8 (eight) hours as needed for mild pain, moderate pain or fever., Starting Sat 08/04/2017, Until Sun 08/04/2018, Print      STOP taking these medications     acetaminophen (TYLENOL) 325 MG tablet      carbidopa-levodopa (SINEMET IR) 25-100 MG tablet      carbidopa-levodopa (SINEMET IR) 25-250 MG tablet      cholecalciferol (VITAMIN D) 1000 units tablet      magnesium hydroxide (MILK OF MAGNESIA) 400 MG/5ML suspension      Melatonin 3 MG TABS      Pimavanserin Tartrate (NUPLAZID) 17 MG TABS      polyethylene glycol (MIRALAX / GLYCOLAX) packet      Psyllium (METAMUCIL FIBER PO)      senna-docusate (SENOKOT-S) 8.6-50 MG tablet      solifenacin (VESICARE) 10 MG tablet      tamsulosin (FLOMAX) 0.4 MG CAPS capsule      traZODone (DESYREL) 50 MG tablet      UNABLE TO FIND      UNABLE TO FIND        Allergies  Allergen Reactions  . Ciprofloxacin Hives and Rash  . Nitrofurantoin Rash      The results of significant diagnostics from this hospitalization (including imaging, microbiology, ancillary and laboratory) are listed below for reference.    Significant Diagnostic Studies: Dg Chest 1 View  Result Date: 07/31/2017 CLINICAL DATA:  Fever and cough for the past 4 days. Recently diagnosed with pneumonia. Hypoxia this morning. EXAM: CHEST 1 VIEW COMPARISON:  Portable chest x-ray of July 28, 2017 FINDINGS: The lungs are reasonably well inflated. Bilateral interstitial and alveolar opacities persist but are slightly less conspicuous today. The retrocardiac region remains dense. There is no pneumothorax or large pleural effusion. The heart is normal in size. The pulmonary vascularity is not clearly engorged. There is calcification in the wall of the aortic arch. IMPRESSION: Bilateral interstitial and alveolar opacities most compatible with pneumonia. There has been slight interval improvement since the study of 2 days ago. Thoracic aortic atherosclerosis. Electronically Signed   By: David  [place] M.D.   On: 07/31/2017 07:50   Ct Head Wo Contrast  Result Date: 07/26/2017 CLINICAL DATA:  Minor head trauma. Fever. Parkinson's disease and dementia. EXAM: CT HEAD WITHOUT CONTRAST TECHNIQUE: Contiguous axial images were obtained from the base  of the skull through the vertex without intravenous contrast. COMPARISON:  Head CT 11/22/2015 FINDINGS: Brain: No mass lesion, intraparenchymal hemorrhage or extra-axial collection. No evidence of acute cortical infarct. There is periventricular hypoattenuation compatible with chronic microvascular disease. There is generalized atrophy, unchanged from the prior study. Vascular: No hyperdense vessel or unexpected calcification. Skull: No calvarial fracture. There is multifocal soft tissue emphysema within the bilateral masticator spaces, left parotid space and in the left posterior paravertebral soft tissues. There are also foci of gas within the epidural venous plexus. Sinuses/Orbits: There is complete opacification of the right frontal and ethmoid sinuses. Normal orbits. IMPRESSION: 1. No acute intracranial abnormality. 2. Generalized atrophy with chronic ischemic microangiopathy. 3. Multiple foci of soft tissue gas within the bilateral subcutaneous fascial tissues, bilateral masticator spaces, left parotid space and in the epidural venous plexus. This is of  uncertain etiology. Is there a history of recent trauma? Given the presence of air within the epidural venous plexus it is possible that the other foci of gas are also within very small veins, in which case it may be secondary to IV infusion. Electronically Signed   By: Deatra Robinson M.D.   On: 07/26/2017 22:44   Ct Angio Chest Pe W Or Wo Contrast  Result Date: 07/27/2017 CLINICAL DATA:  Confusion. Shortness of breath. Elevated D-dimer. Positive for pneumonia. EXAM: CT ANGIOGRAPHY CHEST WITH CONTRAST TECHNIQUE: Multidetector CT imaging of the chest was performed using the standard protocol during bolus administration of intravenous contrast. Multiplanar CT image reconstructions and MIPs were obtained to evaluate the vascular anatomy. CONTRAST:  78 mL Isovue 370 COMPARISON:  Chest radiograph 08/03/2017.  CT chest 11/22/2015 FINDINGS: Cardiovascular: Good opacification of the central and segmental pulmonary arteries. No focal filling defects. No evidence of significant pulmonary embolus. Cardiac enlargement. Reflux of contrast material into the hepatic veins suggesting passive congestion due to right heart failure. Coronary artery and aortic calcifications. No pericardial effusions. Normal caliber thoracic aorta. Mediastinum/Nodes: Bilateral hilar prominence may indicate hilar lymphadenopathy. Esophagus is decompressed. Lungs/Pleura: Diffuse patchy airspace disease scattered throughout both lungs with predominant perihilar distribution. There is severe peribronchial thickening bilaterally. Multiple air bronchograms. Changes are most likely to represent multifocal bronchial pneumonia. Other considerations could include sarcoidosis, cryptogenic organizing pneumonia, or possibly atypical pneumonia such as TB. No pleural effusions. No pneumothorax. Focal narrowing of the bronchus intermedius, likely inflammatory. Focal areas of narrowing of more distal bronchi bilaterally, also likely inflammatory. Upper Abdomen:  Gallbladder appears to be distended. No definite acute process demonstrated in the visualized upper abdomen. Musculoskeletal: Degenerative changes in the spine. No destructive bone lesions. Review of the MIP images confirms the above findings. IMPRESSION: 1. No evidence of significant pulmonary embolus. 2. Diffuse patchy airspace disease throughout the lungs with prominent peribronchial thickening causing areas of bronchial narrowing. This likely represents multifocal bronchopneumonia. 3. Bilateral hilar lymphadenopathy, likely reactive. 4. Cardiac enlargement with evidence of right heart failure. Electronically Signed   By: Burman Nieves M.D.   On: 07/27/2017 03:55   Dg Chest Port 1 View  Result Date: 08/03/2017 CLINICAL DATA:  SOB (shortness of breath) [id number], cough (slightly productive) EXAM: PORTABLE CHEST 1 VIEW COMPARISON:  07/31/2017 FINDINGS: Midline trachea. Cardiomegaly accentuated by AP portable technique. Atherosclerosis in the transverse aorta. Probable layering bilateral pleural effusions. No pneumothorax. Skin folds over the left hemithorax. Lower lobe predominant interstitial and airspace disease is similar to slightly increased, likely accentuated by lower lung volumes today. IMPRESSION: Similar to slightly increased interstitial and airspace disease. Probable  layering bilateral pleural effusions. Favor pulmonary edema. Concurrent infection or aspiration could look similar. Cardiomegaly with aortic atherosclerosis. Electronically Signed   By: Jeronimo Greaves M.D.   On: 08/03/2017 11:33   Dg Chest Port 1 View  Result Date: 07/28/2017 CLINICAL DATA:  Respiratory failure EXAM: PORTABLE CHEST 1 VIEW COMPARISON:  July 26, 2017 FINDINGS: There is a skin fold over the upper right chest. No pneumothorax. Mild cardiomegaly. The hila and mediastinum are normal. Bilateral pulmonary opacities most prominent centrally have mildly worsened in the interval. No other interval changes. IMPRESSION: Mild  worsening of the bilateral multifocal infiltrates, most prominent centrally. Electronically Signed   By: Gerome Sam III M.D   On: 07/28/2017 09:06   Dg Chest Port 1 View  Result Date: 07/26/2017 CLINICAL DATA:  Sepsis EXAM: PORTABLE CHEST 1 VIEW COMPARISON:  CT chest of 11/22/2015 and chest x-ray of the same day FINDINGS: There is patchy airspace disease bilaterally right greater than left. No definite effusion is seen. These changes most likely reflect multifocal pneumonia with congestive heart failure less likely. The heart is within upper limits normal. No acute bony abnormality is seen. IMPRESSION: Patchy airspace disease bilaterally, right greater than left, most consistent with multifocal pneumonia. Electronically Signed   By: Dwyane Dee M.D.   On: 07/26/2017 17:08   Dg Swallowing Func-speech Pathology  Result Date: 07/28/2017 Objective Swallowing Evaluation: Type of Study: MBS-Modified Barium Swallow Study Patient Details Name: Martin Lowe MRN: [id number] Date of Birth: April 08, 1934 Today's Date: 07/28/2017 Time: SLP Start Time (ACUTE ONLY): 1100-SLP Stop Time (ACUTE ONLY): 1130 SLP Time Calculation (min) (ACUTE ONLY): 30 min Past Medical History: Past Medical History: Diagnosis Date . Atrial fibrillation (HCC)  . BPH (benign prostatic hyperplasia)  . Dementia  . Failure to thrive in adult  . Frequent falls  . Hypothyroidism  . Kidney stone  . Parkinson disease (HCC)  . Urinary incontinence  Past Surgical History: Past Surgical History: Procedure Laterality Date . BLADDER SURGERY   . HERNIA REPAIR   . NEPHRECTOMY   . PROSTATE SURGERY   HPI: 83 y.o. male with hx of dementia and Parkinson's dz, FTT  admitted from [facility] with decreased activity, poor PO intake, difficulty swallowing, pna.  Subjective: alert, confused Assessment / Plan / Recommendation CHL IP CLINICAL IMPRESSIONS 07/28/2017 Clinical Impression Pt presents with a moderate oropharyngeal dysphagia.  There is reduced oral control  of bolus material.  All consistencies reach the pyriform sinuses before pharyngeal swallow is initiated.  Thin liquids spill into the larynx and are aspirated before the swallow.  There is residue in the valleculae and pyriforms throughout the study, most of which does not clear, even with cued second swallows.  There is a tendency for pyriform residue to spill posteriorly over the arytenoids into the larynx.  Aspiration was generally trace, and occurred most commonly with thin liquids, but all materials are at risk to be aspirated given deficits described.  A chin tuck did not sufficiently protect the airway.   Returned to pt's room after the study and reviewed results, MBS video, and options for treatment at length and with multiple family members, including son, dtr, son-in-law and several grandchildren.  We discussed inherent aspiration risk given dementia and Parkinson's.  We discussed our ability to minimize but not prevent aspiration with diet/behavior modifications.  We discussed the contraindications of PEGs in pt's with advanced dementia.  [name], with son and daughter, decided to proceed with a modified diet - dysphagia 1/nectar  thick liquids. SLP will follow for PO toleration/safety, and further education with family.  At some point in the near future, if not during this hospitalization, Mrs. Yusuf may benefit from a consultation with Palliative Medicine.   SLP Visit Diagnosis Dysphagia, oropharyngeal phase (R13.12) Attention and concentration deficit following -- Frontal lobe and executive function deficit following -- Impact on safety and function Moderate aspiration risk   CHL IP TREATMENT RECOMMENDATION 07/28/2017 Treatment Recommendations Therapy as outlined in treatment plan below   No flowsheet data found. CHL IP DIET RECOMMENDATION 07/28/2017 SLP Diet Recommendations Nectar thick liquid;Dysphagia 1 (Puree) solids Liquid Administration via Cup Medication Administration Crushed with puree  Compensations Minimize environmental distractions;Slow rate;Small sips/bites Postural Changes Seated upright at 90 degrees   CHL IP OTHER RECOMMENDATIONS 07/28/2017 Recommended Consults -- Oral Care Recommendations Oral care BID Other Recommendations Order thickener from pharmacy   CHL IP FOLLOW UP RECOMMENDATIONS 07/28/2017 Follow up Recommendations Skilled Nursing facility   [facility] IP FREQUENCY AND DURATION 07/28/2017 Speech Therapy Frequency (ACUTE ONLY) min 3x week Treatment Duration 1 week      CHL IP ORAL PHASE 07/28/2017 Oral Phase Impaired Oral - Pudding Teaspoon -- Oral - Pudding Cup -- Oral - Honey Teaspoon -- Oral - Honey Cup -- Oral - Nectar Teaspoon -- Oral - Nectar Cup -- Oral - Nectar Straw Weak lingual manipulation;Lingual pumping;Decreased bolus cohesion Oral - Thin Teaspoon Impaired mastication;Weak lingual manipulation;Decreased bolus cohesion Oral - Thin Cup Impaired mastication;Weak lingual manipulation;Decreased bolus cohesion Oral - Thin Straw -- Oral - Puree Delayed oral transit;Weak lingual manipulation Oral - Mech Soft -- Oral - Regular -- Oral - Multi-Consistency -- Oral - Pill -- Oral Phase - Comment --  CHL IP PHARYNGEAL PHASE 07/28/2017 Pharyngeal Phase Impaired Pharyngeal- Pudding Teaspoon -- Pharyngeal -- Pharyngeal- Pudding Cup -- Pharyngeal -- Pharyngeal- Honey Teaspoon -- Pharyngeal -- Pharyngeal- Honey Cup -- Pharyngeal -- Pharyngeal- Nectar Teaspoon -- Pharyngeal -- Pharyngeal- Nectar Cup -- Pharyngeal -- Pharyngeal- Nectar Straw Reduced epiglottic inversion;Reduced anterior laryngeal mobility;Reduced laryngeal elevation;Reduced airway/laryngeal closure;Reduced tongue base retraction;Penetration/Aspiration before swallow;Pharyngeal residue - valleculae;Pharyngeal residue - pyriform;Compensatory strategies attempted (with notebox);Delayed swallow initiation-pyriform sinuses Pharyngeal Material enters airway, passes BELOW cords and not ejected out despite cough attempt by patient  Pharyngeal- Thin Teaspoon Reduced epiglottic inversion;Reduced anterior laryngeal mobility;Reduced laryngeal elevation;Reduced airway/laryngeal closure;Reduced tongue base retraction;Penetration/Aspiration before swallow;Pharyngeal residue - valleculae;Pharyngeal residue - pyriform;Compensatory strategies attempted (with notebox);Delayed swallow initiation-pyriform sinuses;Trace aspiration Pharyngeal Material enters airway, passes BELOW cords and not ejected out despite cough attempt by patient Pharyngeal- Thin Cup Reduced epiglottic inversion;Reduced anterior laryngeal mobility;Reduced laryngeal elevation;Reduced airway/laryngeal closure;Reduced tongue base retraction;Penetration/Aspiration before swallow;Pharyngeal residue - valleculae;Pharyngeal residue - pyriform;Compensatory strategies attempted (with notebox);Delayed swallow initiation-pyriform sinuses;Trace aspiration;Penetration/Apiration after swallow Pharyngeal Material enters airway, passes BELOW cords and not ejected out despite cough attempt by patient Pharyngeal- Thin Straw -- Pharyngeal -- Pharyngeal- Puree Reduced epiglottic inversion;Reduced anterior laryngeal mobility;Reduced laryngeal elevation;Reduced airway/laryngeal closure;Reduced tongue base retraction;Pharyngeal residue - valleculae;Pharyngeal residue - pyriform;Compensatory strategies attempted (with notebox);Delayed swallow initiation-pyriform sinuses Pharyngeal -- Pharyngeal- Mechanical Soft -- Pharyngeal -- Pharyngeal- Regular -- Pharyngeal -- Pharyngeal- Multi-consistency -- Pharyngeal -- Pharyngeal- Pill -- Pharyngeal -- Pharyngeal Comment --  No flowsheet data found. No flowsheet data found. Blenda Mounts Laurice 07/28/2017, 1:26 PM               Microbiology: Recent Results (from the past 240 hour(s))  Culture, blood (Routine x 2)     Status: None   Collection Time: 07/26/17  4:05 PM  Result  Value Ref Range Status   Specimen Description BLOOD LEFT FOREARM  Final   Special  Requests   Final    BOTTLES DRAWN AEROBIC AND ANAEROBIC Blood Culture adequate volume   Culture NO GROWTH 5 DAYS  Final   Report Status 07/31/2017 FINAL  Final  Culture, blood (Routine x 2)     Status: None   Collection Time: 07/26/17  4:09 PM  Result Value Ref Range Status   Specimen Description BLOOD RIGHT FOREARM  Final   Special Requests   Final    BOTTLES DRAWN AEROBIC AND ANAEROBIC Blood Culture results may not be optimal due to an excessive volume of blood received in culture bottles   Culture NO GROWTH 5 DAYS  Final   Report Status 07/31/2017 FINAL  Final  MRSA PCR Screening     Status: None   Collection Time: 07/26/17 10:46 PM  Result Value Ref Range Status   MRSA by PCR NEGATIVE NEGATIVE Final    Comment:        The GeneXpert MRSA Assay (FDA approved for NASAL specimens only), is one component of a comprehensive MRSA colonization surveillance program. It is not intended to diagnose MRSA infection nor to guide or monitor treatment for MRSA infections.   Culture, Urine     Status: Abnormal   Collection Time: 07/27/17  5:15 AM  Result Value Ref Range Status   Specimen Description URINE, CLEAN CATCH  Final   Special Requests NONE  Final   Culture <10,000 COLONIES/mL (A)  Final   Report Status 07/28/2017 FINAL  Final     Labs: Basic Metabolic Panel:  Recent Labs Lab 07/30/17 0306 07/31/17 0900 08/01/17 0530 08/02/17 0551  NA 139 141 142 140  K 3.1* 4.0 3.6 3.5  CL 110 114* 110 111  CO2 23 19* 23 24  GLUCOSE 119* 94 123* 107*  BUN CREATININE 1.00 1.04 1.03 0.96  CALCIUM 8.3* 8.3* 8.2* 8.3*   Liver Function Tests: No results for input(s): AST, ALT, ALKPHOS, BILITOT, PROT, ALBUMIN in the last 168 hours. No results for input(s): LIPASE, AMYLASE in the last 168 hours. No results for input(s): AMMONIA in the last 168 hours. CBC:  Recent Labs Lab 07/30/17 0306 07/31/17 0559 08/01/17 0530 08/02/17 0551 08/03/17 0437  WBC 6.0 6.1 6.0 4.7 8.1   HGB 11.4* 11.2* 11.4* 11.0* 11.6*  HCT 35.5* 35.6* 35.2* 33.6* 37.2*  MCV 91.7 92.5 92.6 93.3 93.5  PLT 243 223 246 227 245   Cardiac Enzymes: No results for input(s): CKTOTAL, CKMB, CKMBINDEX, TROPONINI in the last 168 hours. BNP: BNP (last 3 results)  Recent Labs  07/26/17 1546  BNP 939.5*    ProBNP (last 3 results) No results for input(s): PROBNP in the last 8760 hours.  CBG:  Recent Labs Lab 07/28/17 1627 07/29/17 0739 07/29/17 1612 07/29/17 2208 08/02/17 0253  GLUCAP 113* 103* 110* 139* 102*       Signed:  Haydee Salter MD  FACP  [facility] 08/04/2017, 10:09 AM

## 2017-08-04 NOTE — Progress Notes (Signed)
I called and gave report to the nurse at [facility] care of [facility] point. Her name is Thayer Ohm and I answered all the questions. Transport have been called.  Colleen Can, RN

## 2017-09-06 DEATH — deceased

## 2017-12-17 IMAGING — DX DG CHEST 1V
1 series · 1 of 1 positions shown · non-contrast
Comparison: Portable chest x-ray July 28, 2017

CLINICAL DATA: Fever and cough for the past 4 days. Recently
diagnosed with pneumonia. Hypoxia this morning.

EXAM:
CHEST 1 VIEW

[chest pa]
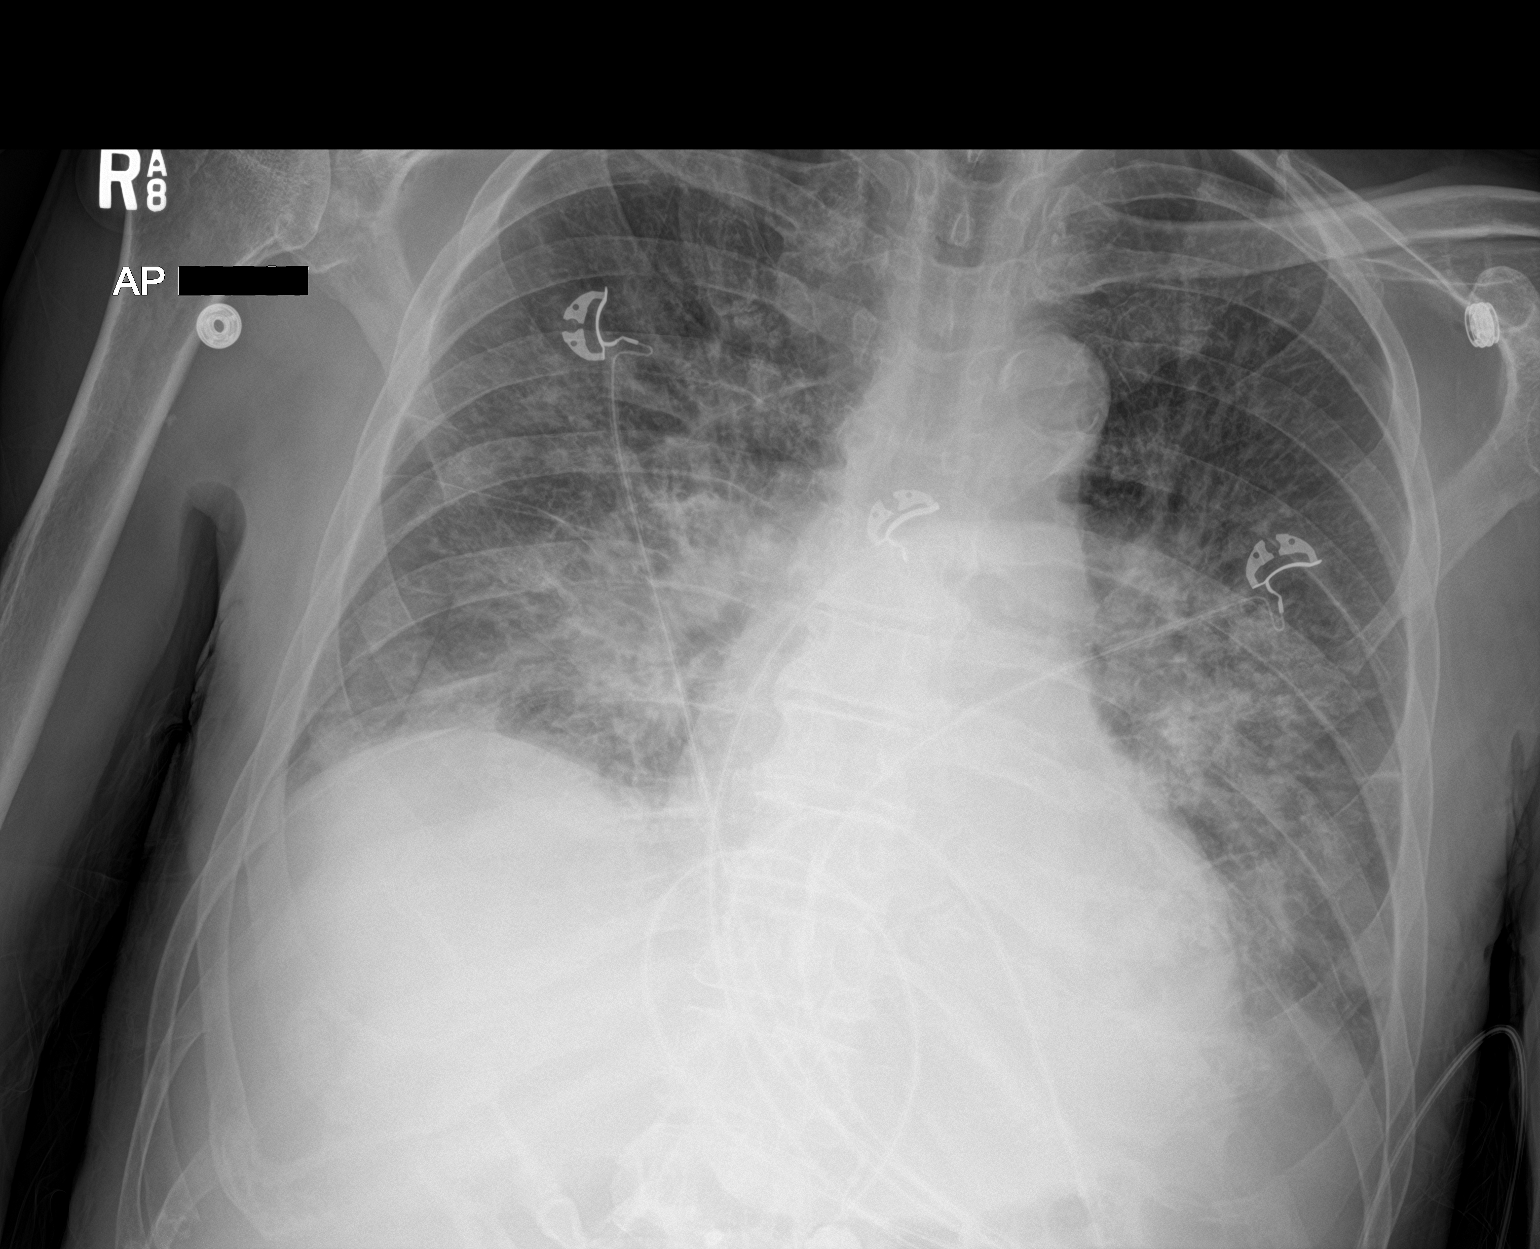

[1 of 1 positions shown; findings below may reference images not displayed]

FINDINGS: The lungs are reasonably well inflated. Bilateral interstitial and
alveolar opacities persist but are slightly less conspicuous today.
The retrocardiac region remains dense. There is no pneumothorax or
large pleural effusion. The heart is normal in size. The pulmonary
vascularity is not clearly engorged. There is calcification in the
wall of the aortic arch.
IMPRESSION: Bilateral interstitial and alveolar opacities most compatible with
pneumonia. There has been slight interval improvement since the
study of 2 days ago.

Thoracic aortic atherosclerosis.
